# Patient Record
Sex: Male | Born: 1985 | Race: White | Hispanic: No | Marital: Married | Smoking: Former smoker
Health system: Southern US, Community
[De-identification: ages and names within clinical notes are randomized; demographics above are authoritative.]

## PROBLEM LIST (undated history)

## (undated) DIAGNOSIS — I10 Essential (primary) hypertension: Secondary | ICD-10-CM

## (undated) DIAGNOSIS — E785 Hyperlipidemia, unspecified: Secondary | ICD-10-CM

## (undated) DIAGNOSIS — F32A Depression, unspecified: Secondary | ICD-10-CM

## (undated) HISTORY — DX: Hyperlipidemia, unspecified: E78.5

## (undated) HISTORY — PX: KNEE SURGERY: SHX244

## (undated) HISTORY — PX: WISDOM TOOTH EXTRACTION: SHX21

## (undated) HISTORY — DX: Depression, unspecified: F32.A

---

## 2019-07-10 ENCOUNTER — Other Ambulatory Visit (INDEPENDENT_AMBULATORY_CARE_PROVIDER_SITE_OTHER): Payer: PRIVATE HEALTH INSURANCE | Admitting: Plastic Surgery

## 2019-07-10 DIAGNOSIS — S62111A Displaced fracture of triquetrum [cuneiform] bone, right wrist, initial encounter for closed fracture: Secondary | ICD-10-CM

## 2019-07-10 NOTE — Progress Notes (Signed)
   Referring Provider No referring provider defined for this encounter.   CC: No chief complaint on file. Right wrist pain  Paul Russo is an 34 y.o. male.  HPI: Patient presents to me with a 3-week history of right-sided wrist pain.  He had a fall on the ice in [place] about 3 weeks ago and landed directly on his right hand with the wrist extended.  He had significant pain immediately but was able to move it.  As the time is passed he has been able to move his wrist and fingers fairly normally however he still has quite a bit of pain when loading the wrist and wrist extension.  He has quite a bit of difficulty pushing up from a chair or pushing up from the floor.  He has tenderness in 1 specific spot over the dorsal ulnar area of the wrist and this is his main concern.  Not on File  No outpatient encounter medications on file as of 07/10/2019.   No facility-administered encounter medications on file as of 07/10/2019.  Noncontributory  No past medical history on file. Noncontributory No family history on file.  Social History   Social History Narrative  . Not on file  Negative for tobacco use  Review of Systems General: Denies fevers, chills, weight loss CV: Denies chest pain, shortness of breath, palpitations  Physical Exam No flowsheet data found.  General:  No acute distress,  Alert and oriented, Non-Toxic, Normal speech and affect Right hand: Fingers are well perfused with normal capillary refill and a palpable radial pulse.  Sensation is intact throughout.  He has full range of motion.  Specifically he has full finger flexion and extension without pain.  He has minimal discomfort with wrist flexion and extension.  He has minimal discomfort with radial and ulnar deviation and pronation/supination.  He is point tender over the dorsal aspect of the triquetrum.  X-ray was performed at his office and read by me.  It suggested dorsal chip fracture of the triquetrum seen on lateral  view.  AP and oblique views look fairly unremarkable with normal carpal spacing and alignment.  Scapholunate interval is preserved in the scapholunate angle on the lateral view is appropriate.  Assessment/Plan Patient is 34 year old male with a suspected dorsal chip fracture of the triquetrum.  His imaging correlates with his clinical findings.  Being that its been 3 weeks and he is still having quite a bit of pain I think it is reasonable to get a CT scan to evaluate this in more detail.  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  We will plan on getting a noncontrast CT scan of the right wrist to evaluate the bony architecture.  Cindra Presume 07/10/2019, 8:55 AM

## 2019-07-20 ENCOUNTER — Ambulatory Visit (HOSPITAL_COMMUNITY): Payer: PRIVATE HEALTH INSURANCE

## 2019-07-21 ENCOUNTER — Other Ambulatory Visit: Payer: Self-pay

## 2019-07-21 ENCOUNTER — Ambulatory Visit (HOSPITAL_COMMUNITY)
Admission: RE | Admit: 2019-07-21 | Discharge: 2019-07-21 | Disposition: A | Discharge status: Home / Self Care | Payer: PRIVATE HEALTH INSURANCE | Source: Ambulatory Visit | Attending: Plastic Surgery | Admitting: Plastic Surgery

## 2019-07-21 DIAGNOSIS — S62111A Displaced fracture of triquetrum [cuneiform] bone, right wrist, initial encounter for closed fracture: Secondary | ICD-10-CM | POA: Diagnosis present

## 2020-03-28 ENCOUNTER — Other Ambulatory Visit: Payer: PRIVATE HEALTH INSURANCE

## 2020-03-28 DIAGNOSIS — Z20822 Contact with and (suspected) exposure to covid-19: Secondary | ICD-10-CM

## 2020-03-29 LAB — NOVEL CORONAVIRUS, NAA: SARS-CoV-2, NAA: NOT DETECTED

## 2020-03-29 LAB — SARS-COV-2, NAA 2 DAY TAT

## 2020-08-16 ENCOUNTER — Ambulatory Visit: Payer: PRIVATE HEALTH INSURANCE | Admitting: Internal Medicine

## 2020-09-19 ENCOUNTER — Ambulatory Visit (INDEPENDENT_AMBULATORY_CARE_PROVIDER_SITE_OTHER): Payer: PRIVATE HEALTH INSURANCE | Admitting: Internal Medicine

## 2020-09-19 ENCOUNTER — Other Ambulatory Visit: Payer: Self-pay

## 2020-09-19 ENCOUNTER — Encounter: Payer: Self-pay | Admitting: Internal Medicine

## 2020-09-19 VITALS — BP 116/74 | HR 74 | Temp 98.3°F | Ht 70.0 in | Wt 233.0 lb

## 2020-09-19 DIAGNOSIS — R0683 Snoring: Secondary | ICD-10-CM

## 2020-09-19 DIAGNOSIS — E6609 Other obesity due to excess calories: Secondary | ICD-10-CM | POA: Diagnosis not present

## 2020-09-19 DIAGNOSIS — Z6832 Body mass index (BMI) 32.0-32.9, adult: Secondary | ICD-10-CM

## 2020-09-19 DIAGNOSIS — R5382 Chronic fatigue, unspecified: Secondary | ICD-10-CM

## 2020-09-19 DIAGNOSIS — E559 Vitamin D deficiency, unspecified: Secondary | ICD-10-CM | POA: Diagnosis not present

## 2020-09-19 DIAGNOSIS — Z0001 Encounter for general adult medical examination with abnormal findings: Secondary | ICD-10-CM

## 2020-09-19 DIAGNOSIS — Z0000 Encounter for general adult medical examination without abnormal findings: Secondary | ICD-10-CM | POA: Diagnosis not present

## 2020-09-19 DIAGNOSIS — Z23 Encounter for immunization: Secondary | ICD-10-CM | POA: Diagnosis not present

## 2020-09-19 LAB — LIPID PANEL
Cholesterol: 200 mg/dL (ref 0–200)
HDL: 49.3 mg/dL (ref >39.00)
LDL Cholesterol: 139 mg/dL — ABNORMAL HIGH (ref 0–99)
NonHDL: 151.11
Total CHOL/HDL Ratio: 4
Triglycerides: 63 mg/dL (ref 0.0–149.0)
VLDL: 12.6 mg/dL (ref 0.0–40.0)

## 2020-09-19 LAB — CBC WITH DIFFERENTIAL/PLATELET
Basophils Absolute: 0.1 10*3/uL (ref 0.0–0.1)
Basophils Relative: 1 % (ref 0.0–3.0)
Eosinophils Absolute: 0.2 10*3/uL (ref 0.0–0.7)
Eosinophils Relative: 2.8 % (ref 0.0–5.0)
HCT: 46.7 % (ref 39.0–52.0)
Hemoglobin: 16.1 g/dL (ref 13.0–17.0)
Lymphocytes Relative: 32.8 % (ref 12.0–46.0)
Lymphs Abs: 2.7 10*3/uL (ref 0.7–4.0)
MCHC: 34.5 g/dL (ref 30.0–36.0)
MCV: 86 fl (ref 78.0–100.0)
Monocytes Absolute: 0.8 10*3/uL (ref 0.1–1.0)
Monocytes Relative: 9.3 % (ref 3.0–12.0)
Neutro Abs: 4.4 10*3/uL (ref 1.4–7.7)
Neutrophils Relative %: 54.1 % (ref 43.0–77.0)
Platelets: 366 10*3/uL (ref 150.0–400.0)
RBC: 5.44 Mil/uL (ref 4.22–5.81)
RDW: 13.5 % (ref 11.5–15.5)
WBC: 8.1 10*3/uL (ref 4.0–10.5)

## 2020-09-19 LAB — BASIC METABOLIC PANEL
BUN: 16 mg/dL (ref 6–23)
CO2: 29 mEq/L (ref 19–32)
Calcium: 9.5 mg/dL (ref 8.4–10.5)
Chloride: 105 mEq/L (ref 96–112)
Creatinine, Ser: 1.14 mg/dL (ref 0.40–1.50)
GFR: 82.37 mL/min (ref >60.00)
Glucose, Bld: 86 mg/dL (ref 70–99)
Potassium: 4.2 mEq/L (ref 3.5–5.1)
Sodium: 141 mEq/L (ref 135–145)

## 2020-09-19 LAB — HEPATIC FUNCTION PANEL
ALT: 41 U/L (ref 0–53)
AST: 17 U/L (ref 0–37)
Albumin: 4.7 g/dL (ref 3.5–5.2)
Alkaline Phosphatase: 64 U/L (ref 39–117)
Bilirubin, Direct: 0.1 mg/dL (ref 0.0–0.3)
Total Bilirubin: 0.5 mg/dL (ref 0.2–1.2)
Total Protein: 7.6 g/dL (ref 6.0–8.3)

## 2020-09-19 LAB — HEMOGLOBIN A1C: Hgb A1c MFr Bld: 5.4 % (ref 4.6–6.5)

## 2020-09-19 NOTE — Patient Instructions (Signed)

## 2020-09-19 NOTE — Progress Notes (Signed)
Subjective:  Patient ID: Paul Russo, male    DOB: 22-Mar-1986  Age: 35 y.o. MRN: [id number]  CC: Annual Exam  This visit occurred during the SARS-CoV-2 public health emergency.  Safety protocols were in place, including screening questions prior to the visit, additional usage of staff PPE, and extensive cleaning of exam room while observing appropriate contact time as indicated for disinfecting solutions.    HPI Paul Russo presents for a CPX and to establish.  He complains of fatigue, low libido, and snoring.  Outpatient Medications Prior to Visit  Medication Sig Dispense Refill  . buPROPion (WELLBUTRIN XL) 150 MG 24 hr tablet Take 150 mg by mouth every morning.    . escitalopram (LEXAPRO) 5 MG tablet Take 5 mg by mouth every morning.    . lamoTRIgine (LAMICTAL) 150 MG tablet lamotrigine 150 mg tablet    . levETIRAcetam (KEPPRA) 250 MG tablet levetiracetam 250 mg tablet    . Multiple Vitamin (DAILY VITES) tablet Take 1 tablet by mouth daily.    . propranolol (INDERAL) 40 MG tablet Take 40 mg by mouth 2 (two) times daily.     No facility-administered medications prior to visit.    ROS Review of Systems  Constitutional: Positive for fatigue. Negative for appetite change, chills, diaphoresis and fever.  HENT: Negative.   Eyes: Negative.   Respiratory: Negative for cough, chest tightness, shortness of breath and wheezing.   Cardiovascular: Negative for chest pain, palpitations and leg swelling.  Gastrointestinal: Negative for abdominal pain, constipation, diarrhea, nausea and vomiting.  Endocrine: Negative.   Genitourinary: Negative.  Negative for difficulty urinating, scrotal swelling and testicular pain.  Musculoskeletal: Negative.  Negative for arthralgias.  Skin: Negative.  Negative for color change.  Neurological: Negative.  Negative for dizziness, weakness, light-headedness and headaches.  Hematological: Negative for adenopathy. Does not bruise/bleed easily.   Psychiatric/Behavioral: Positive for dysphoric mood and sleep disturbance. Negative for suicidal ideas. The patient is not nervous/anxious.     Objective:  BP 116/74   Pulse 74   Temp 98.3 F (36.8 C) (Oral)   Ht 5\' 10"  (1.778 m)   Wt 233 lb (105.7 kg)   SpO2 98%   BMI 33.43 kg/m   BP Readings from Last 3 Encounters:  09/19/20 116/74    Wt Readings from Last 3 Encounters:  09/19/20 233 lb (105.7 kg)    Physical Exam Vitals reviewed.  Constitutional:      Appearance: Normal appearance.  HENT:     Nose: Nose normal.     Mouth/Throat:     Mouth: Mucous membranes are moist.  Eyes:     General: No scleral icterus.    Conjunctiva/sclera: Conjunctivae normal.  Cardiovascular:     Rate and Rhythm: Normal rate and regular rhythm.     Heart sounds: No murmur heard.   Pulmonary:     Effort: Pulmonary effort is normal.     Breath sounds: No stridor. No wheezing, rhonchi or rales.  Abdominal:     General: Abdomen is flat.     Tenderness: There is no abdominal tenderness.  Musculoskeletal:        General: Normal range of motion.     Cervical back: Neck supple.     Right lower leg: No edema.     Left lower leg: No edema.  Lymphadenopathy:     Cervical: No cervical adenopathy.  Skin:    General: Skin is warm and dry.     Coloration: Skin is not pale.  Findings: No rash.  Neurological:     General: No focal deficit present.     Mental Status: He is alert.  Psychiatric:        Mood and Affect: Mood normal.        Behavior: Behavior normal.     Lab Results  Component Value Date   WBC 8.1 09/19/2020   HGB 16.1 09/19/2020   HCT 46.7 09/19/2020   PLT 366.0 09/19/2020   GLUCOSE 86 09/19/2020   CHOL 200 09/19/2020   TRIG 63.0 09/19/2020   HDL 49.30 09/19/2020   LDLCALC 139 (H) 09/19/2020   ALT 41 09/19/2020   AST 17 09/19/2020   NA 141 09/19/2020   K 4.2 09/19/2020   CL 105 09/19/2020   CREATININE 1.14 09/19/2020   BUN 16 09/19/2020   CO2 29 09/19/2020    TSH 0.91 09/19/2020   HGBA1C 5.4 09/19/2020    CT WRIST RIGHT WO CONTRAST  Result Date: 07/21/2019 CLINICAL DATA:  Medial wrist pain. Fall 06/20/2019 EXAM: CT OF THE RIGHT WRIST WITHOUT CONTRAST TECHNIQUE: Multidetector CT imaging of the right wrist was performed according to the standard protocol. Multiplanar CT image reconstructions were also generated. COMPARISON:  None. FINDINGS: Bones/Joint/Cartilage Minimally displaced fracture involving the dorsal cortex of the proximal scaphoid (series 3, images 162-166). Additional minimally displaced fracture involving the dorsal cortex of the distal capitate with intra-articular involvement of the fourth CMC joint (series 3, images 199-201; series 11, images 11-12). No additional fractures. Carpal intraosseous spaces are maintained. No malalignment. No significant degenerative findings. Ligaments Suboptimally assessed by CT. Muscles and Tendons No musculotendinous abnormality identified within the limitations of this exam. Soft tissues Soft tissue swelling at the dorsal aspect of the carpus. No soft tissue fluid collection or hematoma. IMPRESSION: 1. Minimally displaced fracture involving the dorsal cortex of the proximal scaphoid. 2. Additional minimally displaced fracture involving the dorsal cortex of the distal capitate with intra-articular involvement of the fourth CMC joint. Electronically Signed   By: Davina Poke D.O.   On: 07/21/2019 15:48    Assessment & Plan:   Paul Russo was seen today for annual exam.  Diagnoses and all orders for this visit:  Encounter for general adult medical examination with abnormal findings- Exam completed, labs reviewed - statin tx is not indicated, vaccines reviewed, no cancer screenings are indicated, pt ed material was given. -     Lipid panel; Future -     Hepatitis C antibody; Future -     HIV Antibody (routine testing w rflx); Future -     HIV Antibody (routine testing w rflx) -     Hepatitis C antibody -      Lipid panel  Chronic fatigue- Labs are unremarkable. Will recommend a sleep study. -     CBC with Differential/Platelet; Future -     Basic metabolic panel; Future -     VITAMIN D 25 Hydroxy (Vit-D Deficiency, Fractures); Future -     Hepatic function panel; Future -     TSH; Future -     Testosterone Total,Free,Bio, Males; Future -     Prolactin; Future -     Prolactin -     Testosterone Total,Free,Bio, Males -     TSH -     Hepatic function panel -     VITAMIN D 25 Hydroxy (Vit-D Deficiency, Fractures) -     Basic metabolic panel -     CBC with Differential/Platelet  Class 1 obesity due to excess calories  without serious comorbidity with body mass index (BMI) of 32.0 to 32.9 in adult- Labs are neg for secondary causes or complications. -     Basic metabolic panel; Future -     VITAMIN D 25 Hydroxy (Vit-D Deficiency, Fractures); Future -     Hepatic function panel; Future -     Hemoglobin A1c; Future -     Hemoglobin A1c -     Hepatic function panel -     VITAMIN D 25 Hydroxy (Vit-D Deficiency, Fractures) -     Basic metabolic panel  Vitamin D deficiency -     Cholecalciferol 50 MCG (2000 UT) TABS; Take 1 tablet (2,000 Units total) by mouth daily.  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 I am also having him maintain his lamoTRIgine, escitalopram, buPROPion, propranolol, Daily Vites, and levETIRAcetam.  Meds ordered this encounter  Medications  . Cholecalciferol 50 MCG (2000 UT) TABS    Sig: Take 1 tablet (2,000 Units total) by mouth daily.    Dispense:  90 tablet    Refill:  3     Follow-up: Return in about 6 months (around 03/22/2021).  Scarlette Calico, MD

## 2020-09-20 DIAGNOSIS — Z0001 Encounter for general adult medical examination with abnormal findings: Secondary | ICD-10-CM | POA: Insufficient documentation

## 2020-09-20 DIAGNOSIS — Z6832 Body mass index (BMI) 32.0-32.9, adult: Secondary | ICD-10-CM | POA: Insufficient documentation

## 2020-09-20 DIAGNOSIS — E6609 Other obesity due to excess calories: Secondary | ICD-10-CM | POA: Insufficient documentation

## 2020-09-20 DIAGNOSIS — R5382 Chronic fatigue, unspecified: Secondary | ICD-10-CM | POA: Insufficient documentation

## 2020-09-20 DIAGNOSIS — E559 Vitamin D deficiency, unspecified: Secondary | ICD-10-CM | POA: Insufficient documentation

## 2020-09-20 LAB — VITAMIN D 25 HYDROXY (VIT D DEFICIENCY, FRACTURES): VITD: 19.45 ng/mL — ABNORMAL LOW (ref 30.00–100.00)

## 2020-09-20 LAB — TESTOSTERONE TOTAL,FREE,BIO, MALES
Albumin: 4.6 g/dL (ref 3.6–5.1)
Sex Hormone Binding: 32 nmol/L (ref 10–50)
Testosterone, Bioavailable: 100.1 ng/dL — ABNORMAL LOW (ref >=110.0)
Testosterone, Free: 47.7 pg/mL (ref 46.0–224.0)
Testosterone: 362 ng/dL (ref 250–827)

## 2020-09-20 LAB — HEPATITIS C ANTIBODY
Hepatitis C Ab: NONREACTIVE
SIGNAL TO CUT-OFF: 0.01 (ref <1.00)

## 2020-09-20 LAB — TSH: TSH: 0.91 u[IU]/mL (ref 0.35–4.50)

## 2020-09-20 LAB — HIV ANTIBODY (ROUTINE TESTING W REFLEX): HIV 1&2 Ab, 4th Generation: NONREACTIVE

## 2020-09-20 LAB — PROLACTIN: Prolactin: 7.3 ng/mL (ref 2.0–18.0)

## 2020-09-20 MED ORDER — CHOLECALCIFEROL 50 MCG (2000 UT) PO TABS: 1.0000 | ORAL_TABLET | Freq: Every day | ORAL | 3 refills | Status: DC

## 2020-09-22 ENCOUNTER — Encounter: Payer: Self-pay | Admitting: Internal Medicine

## 2020-09-22 ENCOUNTER — Other Ambulatory Visit: Payer: Self-pay | Admitting: Internal Medicine

## 2020-09-22 DIAGNOSIS — R0683 Snoring: Secondary | ICD-10-CM | POA: Insufficient documentation

## 2020-09-22 NOTE — Addendum Note (Signed)
Addended by: Janith Lima on: 09/22/2020 02:11 PM   Modules accepted: Orders

## 2021-01-02 ENCOUNTER — Encounter: Payer: Self-pay | Admitting: Internal Medicine

## 2021-01-03 ENCOUNTER — Other Ambulatory Visit: Payer: Self-pay | Admitting: Internal Medicine

## 2021-01-03 DIAGNOSIS — R0683 Snoring: Secondary | ICD-10-CM

## 2021-02-22 ENCOUNTER — Other Ambulatory Visit: Payer: Self-pay | Admitting: Urology

## 2021-02-22 DIAGNOSIS — D4112 Neoplasm of uncertain behavior of left renal pelvis: Secondary | ICD-10-CM

## 2021-03-14 ENCOUNTER — Encounter (HOSPITAL_COMMUNITY): Payer: Self-pay

## 2021-03-14 ENCOUNTER — Encounter (HOSPITAL_COMMUNITY)
Admission: RE | Admit: 2021-03-14 | Discharge: 2021-03-14 | Disposition: A | Discharge status: Home / Self Care | Payer: No Typology Code available for payment source | Source: Ambulatory Visit | Attending: Urology | Admitting: Urology

## 2021-03-14 ENCOUNTER — Institutional Professional Consult (permissible substitution): Payer: PRIVATE HEALTH INSURANCE | Admitting: Neurology

## 2021-03-14 ENCOUNTER — Other Ambulatory Visit: Payer: Self-pay

## 2021-03-14 DIAGNOSIS — Z01818 Encounter for other preprocedural examination: Secondary | ICD-10-CM | POA: Diagnosis not present

## 2021-03-14 HISTORY — DX: Essential (primary) hypertension: I10

## 2021-03-14 LAB — BASIC METABOLIC PANEL
Anion gap: 9 (ref 5–15)
BUN: 18 mg/dL (ref 6–20)
CO2: 23 mmol/L (ref 22–32)
Calcium: 9.7 mg/dL (ref 8.9–10.3)
Chloride: 109 mmol/L (ref 98–111)
Creatinine, Ser: 1.3 mg/dL — ABNORMAL HIGH (ref 0.61–1.24)
GFR, Estimated: >60 mL/min (ref >60)
Glucose, Bld: 140 mg/dL — ABNORMAL HIGH (ref 70–99)
Potassium: 4.1 mmol/L (ref 3.5–5.1)
Sodium: 141 mmol/L (ref 135–145)

## 2021-03-14 LAB — CBC
HCT: 47.8 % (ref 39.0–52.0)
Hemoglobin: 15.8 g/dL (ref 13.0–17.0)
MCH: 28.8 pg (ref 26.0–34.0)
MCHC: 33.1 g/dL (ref 30.0–36.0)
MCV: 87.2 fL (ref 80.0–100.0)
Platelets: 361 10*3/uL (ref 150–400)
RBC: 5.48 MIL/uL (ref 4.22–5.81)
RDW: 12.3 % (ref 11.5–15.5)
WBC: 6.6 10*3/uL (ref 4.0–10.5)
nRBC: 0 % (ref 0.0–0.2)

## 2021-03-14 NOTE — Progress Notes (Signed)
COVID Vaccine Completed: Yes Date COVID Vaccine completed: 05/27/20 COVID vaccine manufacturer: Pfizer      PCP - Dr. Scarlette Calico. Cardiologist -   Chest x-ray -  EKG -  Stress Test -  ECHO -  Cardiac Cath -  Pacemaker/ICD device last checked:  Sleep Study -  CPAP -   Fasting Blood Sugar -  Checks Blood Sugar _____ times a day  Blood Thinner Instructions: Aspirin Instructions: Last Dose:  Anesthesia review: Hx: HTN.  Patient denies shortness of breath, fever, cough and chest pain at PAT appointment   Patient verbalized understanding of instructions that were given to them at the PAT appointment. Patient was also instructed that they will need to review over the PAT instructions again at home before surgery.

## 2021-03-14 NOTE — Patient Instructions (Signed)
DUE TO COVID-19 ONLY ONE VISITOR IS ALLOWED TO COME WITH YOU AND STAY IN THE WAITING ROOM ONLY DURING PRE OP AND PROCEDURE.   **NO VISITORS ARE ALLOWED IN THE SHORT STAY AREA OR RECOVERY ROOM!!**  IF YOU WILL BE ADMITTED INTO THE HOSPITAL YOU ARE ALLOWED ONLY TWO SUPPORT PEOPLE DURING VISITATION HOURS ONLY (10AM -8PM)   The support person(s) may change daily. The support person(s) must pass our screening, gel in and out, and wear a mask at all times, including in the patient's room. Patients must also wear a mask when staff or their support person are in the room.  No visitors under the age of 1. Any visitor under the age of 28 must be accompanied by an adult.    COVID SWAB TESTING MUST BE COMPLETED ON: 03/30/21  **MUST PRESENT COMPLETED FORM AT TESTING SITE**    [facility] (backside of the building) You are not required to quarantine, however you are required to wear a well-fitted mask when you are out and around people not in your household.  Hand Hygiene often Do NOT share personal items Notify your provider if you are in close contact with someone who has COVID or you develop fever 100.4 or greater, new onset of sneezing, cough, sore throat, shortness of breath or body aches.  [place], [address], must go inside of the hospital, NOT A DRIVE THRU!  (Must self quarantine after testing. Follow instructions on handout.)       Your procedure is scheduled on:    Report to [facility] Main  Entrance   Report to Short Stay at 5:15 AM   [facility])   Call this number if you have problems the morning of surgery [phone]   Do not eat food :After Midnight.   May have liquids until : 4:30 AM.   day of surgery  CLEAR LIQUID DIET  Foods Allowed                                                                     Foods Excluded  Water, Black Coffee and tea, regular and decaf                              liquids that you cannot  Plain Jell-O in any flavor  (No red)                                           see through such as: Fruit ices (not with fruit pulp)                                     milk, soups, orange juice              Iced Popsicles (No red)  All solid food                                   Apple juices Sports drinks like Gatorade (No red) Lightly seasoned clear broth or consume(fat free) Sugar,  Sample Menu Breakfast                                Lunch                                     Supper Cranberry juice                    Beef broth                            Chicken broth Jell-O                                     Grape juice                           Apple juice Coffee or tea                        Jell-O                                      Popsicle                                                Coffee or tea                        Coffee or tea     Oral Hygiene is also important to reduce your risk of infection.                                    Remember - BRUSH YOUR TEETH THE MORNING OF SURGERY WITH YOUR REGULAR TOOTHPASTE   Do NOT smoke after Midnight   Take these medicines the morning of surgery with A SIP OF WATER: lamotrigine,levetiracetam(kepra),Wellbutrin,escitalopram,propranolol.  DO NOT TAKE ANY ORAL DIABETIC MEDICATIONS DAY OF YOUR SURGERY                              You may not have any metal on your body including hair pins, jewelry, and body piercing             Do not wear lotions, powders, perfumes/cologne, or deodorant               Men may shave face and neck.   Do not bring valuables to the hospital. [place].  Contacts, dentures or bridgework may not be worn into surgery.   Bring small overnight bag day of surgery.    Patients discharged the day of surgery will not be allowed to drive home.   Special Instructions: Bring a copy  of your healthcare power of attorney and living will documents         the day of surgery if you haven't scanned them in before.              Please read over the following fact sheets you were given: IF YOU HAVE QUESTIONS ABOUT YOUR PRE OP INSTRUCTIONS PLEASE CALL [phone]   [place] - Preparing for Surgery Before surgery, you can play an important role.  Because skin is not sterile, your skin needs to be as free of germs as possible.  You can reduce the number of germs on your skin by washing with CHG (chlorahexidine gluconate) soap before surgery.  CHG is an antiseptic cleaner which kills germs and bonds with the skin to continue killing germs even after washing. Please DO NOT use if you have an allergy to CHG or antibacterial soaps.  If your skin becomes reddened/irritated stop using the CHG and inform your nurse when you arrive at Short Stay. Do not shave (including legs and underarms) for at least 48 hours prior to the first CHG shower.  You may shave your face/neck. Please follow these instructions carefully:  1.  Shower with CHG Soap the night before surgery and the  morning of Surgery.  2.  If you choose to wash your hair, wash your hair first as usual with your  normal  shampoo.  3.  After you shampoo, rinse your hair and body thoroughly to remove the  shampoo.                           4.  Use CHG as you would any other liquid soap.  You can apply chg directly  to the skin and wash                       Gently with a scrungie or clean washcloth.  5.  Apply the CHG Soap to your body ONLY FROM THE NECK DOWN.   Do not use on face/ open                           Wound or open sores. Avoid contact with eyes, ears mouth and genitals (private parts).                       Wash face,  Genitals (private parts) with your normal soap.             6.  Wash thoroughly, paying special attention to the area where your surgery  will be performed.  7.  Thoroughly rinse your body with warm water  from the neck down.  8.  DO NOT shower/wash with your normal soap after using and rinsing off  the CHG Soap.                9.  Pat yourself dry with a clean towel.            10.  Wear clean pajamas.            11.  Place clean sheets on your bed the night of your first  shower and do not  sleep with pets. Day of Surgery : Do not apply any lotions/deodorants the morning of surgery.  Please wear clean clothes to the hospital/surgery center.  FAILURE TO FOLLOW THESE INSTRUCTIONS MAY RESULT IN THE CANCELLATION OF YOUR SURGERY PATIENT SIGNATURE_________________________________  NURSE SIGNATURE__________________________________  ________________________________________________________________________

## 2021-03-30 ENCOUNTER — Other Ambulatory Visit: Payer: Self-pay | Admitting: Urology

## 2021-03-31 LAB — SARS CORONAVIRUS 2 (TAT 6-24 HRS): SARS Coronavirus 2: NEGATIVE

## 2021-03-31 NOTE — H&P (Signed)
Office Visit Report     03/14/2021   --------------------------------------------------------------------------------   Paul Russo  MRN: [id number]  DOB: 12/23/1985, 35 year old Male  SSN:    PRIMARY CARE:    REFERRING:    PROVIDER:  Festus Aloe, M.D.  TREATING:  Raynelle Bring, M.D.  LOCATION:  [facility] 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 A dedicated CT of the abdomen with and without contrast was performed on 02/17/21 and confirmed a 2.8 cm enhancing renal mass consistent with renal malignancy.   Family history of kidney cancer: None.  Family history of ESRD: None.   Imaging: CT abdomen (02/17/21)  Side of renal neoplasm: Left  Size of renal neoplasm: 2.8 cm  Location of renal neoplasm: Left anterior upper pole  Exophytic or endophytic: Endophytic  Renal nephrometry score: 8a   Renal artery anatomy: Single renal artery  Renal vein anatomy: Single renal vein   Contralateral renal lesions: None.  Regional lymphadenopathy: None.  Adrenal masses: None.  Renal vein/IVC involvement: No.  Metastatic disease to the abdomen: No.   Chest imaging: CT chest (02/23/21)  LFTs: Normal.   Baseline renal function: Cr 1.3, eGFR ml/min   PMH: Past medical history is significant for no major medical comorbidities.  PSH: No abdominal surgeries.     ALLERGIES: No Allergies    MEDICATIONS: Bupropion Hcl  Keppra  Lamictal  Lexapro     GU PSH: Locm 300-399Mg/Ml Iodine,1Ml - 02/17/2021 Vasectomy - 03/04/2020     NON-GU PSH: Knee Arthroscopy     GU PMH: Left uncertain neoplasm of kidney - 02/23/2021, - 02/17/2021    NON-GU PMH: Low testosterone, check endocrine panel. ALso he has had right wrist swelling for a day and  requested a uric acid. - 09/21/2020    FAMILY HISTORY: No Family History    SOCIAL HISTORY: Marital Status: Unknown    REVIEW OF SYSTEMS:    GU Review Male:   Patient denies frequent urination, hard to postpone urination, burning/ pain with urination, get up at night to urinate, leakage of urine, stream starts and stops, trouble starting your streams, and have to strain to urinate .  Gastrointestinal (Lower):   Patient denies diarrhea and constipation.  Gastrointestinal (Upper):   Patient denies vomiting and nausea.  Constitutional:   Patient denies fever, night sweats, weight loss, and fatigue.  Skin:   Patient denies skin rash/ lesion and itching.  Eyes:   Patient denies blurred vision and double vision.  Ears/ Nose/ Throat:   Patient denies sore throat and sinus problems.  Hematologic/Lymphatic:   Patient denies swollen glands and easy bruising.  Cardiovascular:   Patient denies leg swelling and chest pains.  Respiratory:   Patient denies cough and shortness of breath.  Endocrine:   Patient denies excessive thirst.  Musculoskeletal:   Patient denies back pain and joint pain.  Neurological:   Patient denies headaches and dizziness.  Psychologic:   Patient denies depression and anxiety.   VITAL SIGNS: None   MULTI-SYSTEM PHYSICAL EXAMINATION:    Constitutional: Well-nourished. No physical deformities. Normally developed. Good grooming.  Neck: Neck symmetrical, not swollen. Normal tracheal position.  Respiratory: No labored breathing, no use of accessory muscles. Clear bilaterally.  Cardiovascular: Normal temperature, normal extremity pulses, no swelling, no  varicosities. RRR.  Lymphatic: No enlargement of neck, axillae, groin.  Skin: No paleness, no jaundice, no cyanosis. No lesion, no ulcer, no rash.  Neurologic / Psychiatric: Oriented to time, oriented to place, oriented to person. No depression, no anxiety, no agitation.  Gastrointestinal: No mass, no tenderness, no rigidity, non  obese abdomen.  Eyes: Normal conjunctivae. Normal eyelids.  Ears, Nose, Mouth, and Throat: Left ear no scars, no lesions, no masses. Right ear no scars, no lesions, no masses. Nose no scars, no lesions, no masses. Normal hearing. Normal lips.  Musculoskeletal: Normal gait and station of head and neck.     Complexity of Data:  Records Review:   Previous Patient Records  X-Ray Review: C.T. Chest/ Abd/Pelvis: Reviewed Films.     02/17/21 12/08/20  Hormones  Testosterone, Total 1049.4 ng/dL 843.4 ng/dL   Notes:                     CLINICAL DATA: Further evaluation of renal lesion seen on prior MRI   EXAM:  CT ABDOMEN WITHOUT AND WITH CONTRAST   TECHNIQUE:  Multidetector CT imaging of the abdomen was performed following the  standard protocol before and following the bolus administration of  intravenous contrast.   CONTRAST: 80 cc of Omnipaque 350   COMPARISON: None available for comparison at time dictation.   FINDINGS:  Lower chest: No acute abnormality.   Hepatobiliary: Fatty infiltration along the falciform ligament. No  suspicious hepatic lesion. Gallbladder is unremarkable.   Pancreas: Within normal limits.   Spleen: Within normal limits.   Adrenals/Urinary Tract: Bilateral adrenal glands are unremarkable.   No hydronephrosis.   Heterogeneous enhancing 2.5 x 2.1 x 2.8 cm left lateral interpolar  renal lesion with internal foci of dystrophic calcification on image  49/5.   Hypodense 5 mm left medial interpolar renal lesion which is  technically too small to accurately characterize.   Right kidney is unremarkable.   Stomach/Bowel: Stomach is within normal limits. Appendix appears  normal. No evidence of bowel wall thickening, distention, or  inflammatory changes.   Vascular/Lymphatic: No abdominal aortic aneurysm. Left renal vein is  patent without suspicious intraluminal filling defect. No  pathologically enlarged abdominal lymph nodes.   Other: No abdominal  ascites.   Musculoskeletal: No acute osseous abnormality. No aggressive lytic  or blastic lesion of bone.   IMPRESSION:  1. Heterogeneous enhancing 2.8 cm left lateral interpolar renal  lesion with internal foci of dystrophic calcification. Findings are  highly suspicious for renal cell carcinoma.  2. No pathologically enlarged abdominal lymph nodes.  3. No evidence of tumor in vein.    Electronically Signed  By: Dahlia Bailiff M.D.  On: 02/20/2021 09:21   CLINICAL DATA: Left enhancing renal mass compatible with renal cell  carcinoma, staging workup   EXAM:  CT CHEST WITHOUT CONTRAST   TECHNIQUE:  Multidetector CT imaging of the chest was performed following the  standard protocol without IV contrast.   COMPARISON: CT abdomen 02/18/2020   FINDINGS:  Cardiovascular: Unremarkable   Mediastinum/Nodes: No thoracic adenopathy identified. No specific  esophageal abnormality observed.   Lungs/Pleura: 2 mm subpleural nodule at the right lung apex on image  20 series 3. Mild scarring or subsegmental atelectasis medially in  the right middle lobe for example on image 79 series 3. 2 mm  subpleural right upper lobe nodule along the upper margin of the  right major fissure on image 40 series 3. 2 mm left apicoposterior  segment upper  lobe nodule on image 18 series 3. Anterior accessory  fissure in the left upper lobe.   Upper Abdomen: Small calcification along the margin of the known  left mid kidney mass.   Musculoskeletal: Mild bilateral gynecomastia. Minimal lower thoracic  spondylosis. No findings of osseous metastatic disease in the  thorax.   IMPRESSION:  1. I identify three tiny 2 mm subpleural nodules in the lungs. These  are highly likely to be benign, but in the context of patient's  renal mass these merit surveillance by chest CT.    Electronically Signed  By: Van Clines M.D.  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            Document Letter(s):  Created for Patient: Clinical Summary         Notes:   1. Left renal neoplasm: The patient was provided information regarding their renal mass including the relative risk of benign versus malignant pathology and the natural history of renal cell carcinoma and other possible malignancies of the kidney. The role of renal biopsy, laboratory testing, and imaging studies to further characterize renal masses and/or the presence of metastatic disease were explained. We discussed the role of active surveillance, surgical therapy with both radical nephrectomy and nephron-sparing surgery, and ablative therapy in the treatment of renal masses. In addition, we discussed our goals of providing an accurate diagnosis and oncologic control while maintaining optimal renal function as appropriate based on the size, location, and complexity of their renal mass as well as their co-morbidities. We have discussed the risks of treatment in detail including but not limited to bleeding, infection, heart attack, stroke, death, venothromoboembolism, cancer recurrence, injury/damage to surrounding organs and structures, urine leak, the possibility of open surgical conversion for patients undergoing minimally invasive surgery, the risk of developing chronic kidney disease and its associated implications, and the potential risk of end stage renal disease possibly necessitating dialysis.   Paul Russo wishes to proceed with left RAL partial nephrectomy. This has been scheduled.      PREVIOUSLY APPENDED NOTES:   We will plan to have him get repeat imaging of the chest and abdomen in the future to monitor his very small non-specific pulmonary nodules seen on his chest CT  during his surveillance imaging follow up. * Entered byLester Chaseton Yepiz, 03/16/2021 11:18 AM*          Next Appointment:      Next Appointment: 04/03/2021 07:15 AM    Appointment Type: Surgery     Location: [facility] [phone]    Provider: Raynelle Bring, M.D.    Reason for Visit: [facility]/EXT REC LEFT RA LAP PARTIAL NEPHRECTOMY WITH AMANDA

## 2021-04-02 NOTE — Anesthesia Preprocedure Evaluation (Addendum)
Anesthesia Evaluation  Patient identified by MRN, date of birth, ID band Patient awake    Reviewed: Allergy & Precautions, NPO status , Patient's Chart, lab work & pertinent test results  Airway Mallampati: I  TM Distance: >3 FB Neck ROM: Full    Dental no notable dental hx.    Pulmonary former smoker,    Pulmonary exam normal breath sounds clear to auscultation       Cardiovascular hypertension, Normal cardiovascular exam Rhythm:Regular Rate:Normal     Neuro/Psych PSYCHIATRIC DISORDERS Depression negative neurological ROS     GI/Hepatic negative GI ROS, Neg liver ROS,   Endo/Other  negative endocrine ROS  Renal/GU negative Renal ROS     Musculoskeletal negative musculoskeletal ROS (+)   Abdominal   Peds  Hematology negative hematology ROS (+)   Anesthesia Other Findings LEFT RENAL NEOPLASM  Reproductive/Obstetrics                            Anesthesia Physical Anesthesia Plan  ASA: 2  Anesthesia Plan: General   Post-op Pain Management:    Induction: Intravenous  PONV Risk Score and Plan: 2 and Ondansetron, Dexamethasone, Midazolam and Treatment may vary due to age or medical condition  Airway Management Planned: Oral ETT  Additional Equipment:   Intra-op Plan:   Post-operative Plan: Extubation in OR  Informed Consent: I have reviewed the patients History and Physical, chart, labs and discussed the procedure including the risks, benefits and alternatives for the proposed anesthesia with the patient or authorized representative who has indicated his/her understanding and acceptance.     Dental advisory given  Plan Discussed with: CRNA  Anesthesia Plan Comments:        Anesthesia Quick Evaluation

## 2021-04-03 ENCOUNTER — Other Ambulatory Visit: Payer: Self-pay

## 2021-04-03 ENCOUNTER — Ambulatory Visit (HOSPITAL_COMMUNITY): Payer: PRIVATE HEALTH INSURANCE | Admitting: Certified Registered Nurse Anesthetist

## 2021-04-03 ENCOUNTER — Observation Stay (HOSPITAL_COMMUNITY)
Admission: RE | Admit: 2021-04-03 | Discharge: 2021-04-04 | Disposition: A | Discharge status: Home / Self Care | Payer: PRIVATE HEALTH INSURANCE | Source: Ambulatory Visit | Attending: Urology | Admitting: Urology

## 2021-04-03 ENCOUNTER — Encounter (HOSPITAL_COMMUNITY)
Admission: RE | Disposition: A | Discharge status: Home / Self Care | Payer: Self-pay | Source: Ambulatory Visit | Attending: Urology

## 2021-04-03 ENCOUNTER — Encounter (HOSPITAL_COMMUNITY): Payer: Self-pay | Admitting: Urology

## 2021-04-03 DIAGNOSIS — C642 Malignant neoplasm of left kidney, except renal pelvis: Principal | ICD-10-CM | POA: Insufficient documentation

## 2021-04-03 DIAGNOSIS — D49512 Neoplasm of unspecified behavior of left kidney: Secondary | ICD-10-CM | POA: Diagnosis present

## 2021-04-03 HISTORY — PX: ROBOTIC ASSITED PARTIAL NEPHRECTOMY: SHX6087

## 2021-04-03 LAB — BASIC METABOLIC PANEL
Anion gap: 4 — ABNORMAL LOW (ref 5–15)
BUN: 14 mg/dL (ref 6–20)
CO2: 27 mmol/L (ref 22–32)
Calcium: 8.6 mg/dL — ABNORMAL LOW (ref 8.9–10.3)
Chloride: 106 mmol/L (ref 98–111)
Creatinine, Ser: 1.63 mg/dL — ABNORMAL HIGH (ref 0.61–1.24)
GFR, Estimated: 55 mL/min — ABNORMAL LOW (ref >60)
Glucose, Bld: 108 mg/dL — ABNORMAL HIGH (ref 70–99)
Potassium: 5 mmol/L (ref 3.5–5.1)
Sodium: 137 mmol/L (ref 135–145)

## 2021-04-03 LAB — HEMOGLOBIN AND HEMATOCRIT, BLOOD
HCT: 47.4 % (ref 39.0–52.0)
Hemoglobin: 15.6 g/dL (ref 13.0–17.0)

## 2021-04-03 LAB — TYPE AND SCREEN
ABO/RH(D): O POS
Antibody Screen: NEGATIVE

## 2021-04-03 LAB — ABO/RH: ABO/RH(D): O POS

## 2021-04-03 SURGERY — NEPHRECTOMY, PARTIAL, ROBOT-ASSISTED
Anesthesia: General | Laterality: Left

## 2021-04-03 MED ORDER — KETAMINE HCL 10 MG/ML IJ SOLN
INTRAMUSCULAR | Status: AC
  Filled 2021-04-03: qty 1

## 2021-04-03 MED ORDER — KETAMINE HCL 10 MG/ML IJ SOLN
INTRAMUSCULAR | Status: DC | PRN
  Administered 2021-04-03: 40 mg via INTRAVENOUS
  Administered 2021-04-03: 10 mg via INTRAVENOUS

## 2021-04-03 MED ORDER — CHLORHEXIDINE GLUCONATE 0.12 % MT SOLN: 15.0000 mL | Freq: Once | OROMUCOSAL | Status: AC

## 2021-04-03 MED ORDER — BUPIVACAINE LIPOSOME 1.3 % IJ SUSP
INTRAMUSCULAR | Status: DC | PRN
  Administered 2021-04-03: 20 mL

## 2021-04-03 MED ORDER — OXYCODONE HCL 5 MG/5ML PO SOLN: 5.0000 mg | Freq: Once | ORAL | Status: AC | PRN

## 2021-04-03 MED ORDER — DEXAMETHASONE SODIUM PHOSPHATE 4 MG/ML IJ SOLN
INTRAMUSCULAR | Status: DC | PRN
  Administered 2021-04-03: 10 mg via INTRAVENOUS

## 2021-04-03 MED ORDER — ONDANSETRON HCL 4 MG/2ML IJ SOLN: 4.0000 mg | INTRAMUSCULAR | Status: DC | PRN

## 2021-04-03 MED ORDER — LACTATED RINGERS IV SOLN: INTRAVENOUS | Status: DC

## 2021-04-03 MED ORDER — EPHEDRINE 5 MG/ML INJ
INTRAVENOUS | Status: AC
  Filled 2021-04-03: qty 5

## 2021-04-03 MED ORDER — POLYETHYLENE GLYCOL 3350 17 G PO PACK: 17.0000 g | PACK | Freq: Once | ORAL | Status: DC

## 2021-04-03 MED ORDER — OXYCODONE HCL 5 MG PO TABS
5.0000 mg | ORAL_TABLET | ORAL | Status: DC | PRN
  Administered 2021-04-03 – 2021-04-04 (×5): 5 mg via ORAL
  Filled 2021-04-03 (×5): qty 1

## 2021-04-03 MED ORDER — MORPHINE SULFATE (PF) 4 MG/ML IV SOLN
INTRAVENOUS | Status: AC
  Filled 2021-04-03: qty 1

## 2021-04-03 MED ORDER — MELATONIN 3 MG PO TABS
3.0000 mg | ORAL_TABLET | Freq: Every day | ORAL | Status: DC
  Administered 2021-04-03: 3 mg via ORAL
  Filled 2021-04-03: qty 1

## 2021-04-03 MED ORDER — ORAL CARE MOUTH RINSE
15.0000 mL | Freq: Once | OROMUCOSAL | Status: AC
  Administered 2021-04-03: 15 mL via OROMUCOSAL

## 2021-04-03 MED ORDER — ACETAMINOPHEN 10 MG/ML IV SOLN
1000.0000 mg | Freq: Four times a day (QID) | INTRAVENOUS | Status: AC
  Administered 2021-04-03 – 2021-04-04 (×4): 1000 mg via INTRAVENOUS
  Filled 2021-04-03 (×4): qty 100

## 2021-04-03 MED ORDER — TRAMADOL HCL 50 MG PO TABS: 50.0000 mg | ORAL_TABLET | Freq: Four times a day (QID) | ORAL | 0 refills | Status: DC | PRN

## 2021-04-03 MED ORDER — FENTANYL CITRATE (PF) 100 MCG/2ML IJ SOLN
INTRAMUSCULAR | Status: AC
  Filled 2021-04-03: qty 2

## 2021-04-03 MED ORDER — SUGAMMADEX SODIUM 200 MG/2ML IV SOLN
INTRAVENOUS | Status: DC | PRN
  Administered 2021-04-03: 300 mg via INTRAVENOUS

## 2021-04-03 MED ORDER — FENTANYL CITRATE PF 50 MCG/ML IJ SOSY
25.0000 ug | PREFILLED_SYRINGE | INTRAMUSCULAR | Status: DC | PRN
  Administered 2021-04-03 (×2): 50 ug via INTRAVENOUS

## 2021-04-03 MED ORDER — ROCURONIUM BROMIDE 10 MG/ML (PF) SYRINGE
PREFILLED_SYRINGE | INTRAVENOUS | Status: AC
  Filled 2021-04-03: qty 10

## 2021-04-03 MED ORDER — FENTANYL CITRATE (PF) 250 MCG/5ML IJ SOLN
INTRAMUSCULAR | Status: AC
  Filled 2021-04-03: qty 5

## 2021-04-03 MED ORDER — LIDOCAINE HCL (PF) 2 % IJ SOLN
INTRAMUSCULAR | Status: DC | PRN
  Administered 2021-04-03: 1.5 mg/kg/h via INTRADERMAL

## 2021-04-03 MED ORDER — PROPOFOL 500 MG/50ML IV EMUL
INTRAVENOUS | Status: DC | PRN
  Administered 2021-04-03: 25 ug/kg/min via INTRAVENOUS

## 2021-04-03 MED ORDER — AMISULPRIDE (ANTIEMETIC) 5 MG/2ML IV SOLN
INTRAVENOUS | Status: AC
  Filled 2021-04-03: qty 4

## 2021-04-03 MED ORDER — LEVETIRACETAM 250 MG PO TABS
125.0000 mg | ORAL_TABLET | Freq: Every day | ORAL | Status: DC
  Administered 2021-04-04: 125 mg via ORAL

## 2021-04-03 MED ORDER — FENTANYL CITRATE PF 50 MCG/ML IJ SOSY
PREFILLED_SYRINGE | INTRAMUSCULAR | Status: AC
  Filled 2021-04-03: qty 1

## 2021-04-03 MED ORDER — OXYCODONE HCL 5 MG PO TABS
ORAL_TABLET | ORAL | Status: AC
  Filled 2021-04-03: qty 1

## 2021-04-03 MED ORDER — DEXAMETHASONE SODIUM PHOSPHATE 10 MG/ML IJ SOLN
INTRAMUSCULAR | Status: AC
  Filled 2021-04-03: qty 1

## 2021-04-03 MED ORDER — PROPOFOL 10 MG/ML IV BOLUS
INTRAVENOUS | Status: DC | PRN
  Administered 2021-04-03: 200 mg via INTRAVENOUS

## 2021-04-03 MED ORDER — BUPROPION HCL ER (XL) 150 MG PO TB24
150.0000 mg | ORAL_TABLET | Freq: Every morning | ORAL | Status: DC
  Administered 2021-04-04: 150 mg via ORAL
  Filled 2021-04-03 (×2): qty 1

## 2021-04-03 MED ORDER — EPHEDRINE SULFATE-NACL 50-0.9 MG/10ML-% IV SOSY
PREFILLED_SYRINGE | INTRAVENOUS | Status: DC | PRN
  Administered 2021-04-03: 5 mg via INTRAVENOUS

## 2021-04-03 MED ORDER — DEXTROSE-NACL 5-0.45 % IV SOLN: INTRAVENOUS | Status: DC

## 2021-04-03 MED ORDER — MIDAZOLAM HCL 2 MG/2ML IJ SOLN
INTRAMUSCULAR | Status: AC
  Filled 2021-04-03: qty 2

## 2021-04-03 MED ORDER — ONDANSETRON HCL 4 MG/2ML IJ SOLN
INTRAMUSCULAR | Status: DC | PRN
  Administered 2021-04-03: 4 mg via INTRAVENOUS

## 2021-04-03 MED ORDER — STERILE WATER FOR IRRIGATION IR SOLN
Status: DC | PRN
  Administered 2021-04-03: 1000 mL

## 2021-04-03 MED ORDER — CEFAZOLIN SODIUM-DEXTROSE 2-4 GM/100ML-% IV SOLN
2.0000 g | Freq: Once | INTRAVENOUS | Status: AC
  Administered 2021-04-03: 2 g via INTRAVENOUS
  Filled 2021-04-03: qty 100

## 2021-04-03 MED ORDER — SODIUM CHLORIDE (PF) 0.9 % IJ SOLN
INTRAMUSCULAR | Status: DC | PRN
  Administered 2021-04-03: 20 mL

## 2021-04-03 MED ORDER — DOCUSATE SODIUM 100 MG PO CAPS
100.0000 mg | ORAL_CAPSULE | Freq: Two times a day (BID) | ORAL | Status: DC
  Administered 2021-04-03 – 2021-04-04 (×2): 100 mg via ORAL
  Filled 2021-04-03 (×2): qty 1

## 2021-04-03 MED ORDER — DIPHENHYDRAMINE HCL 12.5 MG/5ML PO ELIX
12.5000 mg | ORAL_SOLUTION | Freq: Four times a day (QID) | ORAL | Status: DC | PRN
  Filled 2021-04-03: qty 10

## 2021-04-03 MED ORDER — AMISULPRIDE (ANTIEMETIC) 5 MG/2ML IV SOLN
10.0000 mg | Freq: Once | INTRAVENOUS | Status: AC | PRN
  Administered 2021-04-03: 10 mg via INTRAVENOUS

## 2021-04-03 MED ORDER — LIDOCAINE 2% (20 MG/ML) 5 ML SYRINGE
INTRAMUSCULAR | Status: DC | PRN
  Administered 2021-04-03: 60 mg via INTRAVENOUS

## 2021-04-03 MED ORDER — SODIUM CHLORIDE (PF) 0.9 % IJ SOLN
INTRAMUSCULAR | Status: AC
  Filled 2021-04-03: qty 20

## 2021-04-03 MED ORDER — ROCURONIUM BROMIDE 10 MG/ML (PF) SYRINGE
PREFILLED_SYRINGE | INTRAVENOUS | Status: DC | PRN
  Administered 2021-04-03: 60 mg via INTRAVENOUS
  Administered 2021-04-03 (×2): 10 mg via INTRAVENOUS
  Administered 2021-04-03: 20 mg via INTRAVENOUS
  Administered 2021-04-03: 40 mg via INTRAVENOUS

## 2021-04-03 MED ORDER — DOCUSATE SODIUM 100 MG PO CAPS: 100.0000 mg | ORAL_CAPSULE | Freq: Two times a day (BID) | ORAL | Status: DC

## 2021-04-03 MED ORDER — LIDOCAINE HCL 2 % IJ SOLN
INTRAMUSCULAR | Status: AC
  Filled 2021-04-03: qty 20

## 2021-04-03 MED ORDER — MORPHINE SULFATE (PF) 4 MG/ML IV SOLN
2.0000 mg | INTRAVENOUS | Status: DC | PRN
  Administered 2021-04-03 – 2021-04-04 (×9): 4 mg via INTRAVENOUS
  Filled 2021-04-03 (×7): qty 1

## 2021-04-03 MED ORDER — OXYCODONE HCL 5 MG PO TABS
5.0000 mg | ORAL_TABLET | Freq: Once | ORAL | Status: AC | PRN
  Administered 2021-04-03: 5 mg via ORAL

## 2021-04-03 MED ORDER — LACTATED RINGERS IR SOLN
Status: DC | PRN
  Administered 2021-04-03: 3000 mL

## 2021-04-03 MED ORDER — ESCITALOPRAM OXALATE 5 MG PO TABS
5.0000 mg | ORAL_TABLET | Freq: Every morning | ORAL | Status: DC
  Administered 2021-04-04: 5 mg via ORAL
  Filled 2021-04-03 (×2): qty 1

## 2021-04-03 MED ORDER — CEFAZOLIN SODIUM-DEXTROSE 1-4 GM/50ML-% IV SOLN
1.0000 g | Freq: Three times a day (TID) | INTRAVENOUS | Status: AC
Start: 2021-04-03 — End: 2021-04-04
  Administered 2021-04-03 – 2021-04-04 (×2): 1 g via INTRAVENOUS
  Filled 2021-04-03 (×3): qty 50

## 2021-04-03 MED ORDER — BUPIVACAINE LIPOSOME 1.3 % IJ SUSP
INTRAMUSCULAR | Status: AC
  Filled 2021-04-03: qty 20

## 2021-04-03 MED ORDER — PROPOFOL 1000 MG/100ML IV EMUL
INTRAVENOUS | Status: AC
  Filled 2021-04-03: qty 100

## 2021-04-03 MED ORDER — PROMETHAZINE HCL 25 MG/ML IJ SOLN: 6.2500 mg | INTRAMUSCULAR | Status: DC | PRN

## 2021-04-03 MED ORDER — ACETAMINOPHEN 500 MG PO TABS
1000.0000 mg | ORAL_TABLET | Freq: Once | ORAL | Status: AC
  Administered 2021-04-03: 1000 mg via ORAL
  Filled 2021-04-03: qty 2

## 2021-04-03 MED ORDER — LAMOTRIGINE 150 MG PO TABS
150.0000 mg | ORAL_TABLET | Freq: Every day | ORAL | Status: DC
  Administered 2021-04-04: 150 mg via ORAL
  Filled 2021-04-03 (×2): qty 1

## 2021-04-03 MED ORDER — LEVETIRACETAM 250 MG PO TABS
250.0000 mg | ORAL_TABLET | Freq: Every day | ORAL | Status: DC
  Administered 2021-04-03: 250 mg via ORAL
  Filled 2021-04-03 (×2): qty 1

## 2021-04-03 MED ORDER — MIDAZOLAM HCL 5 MG/5ML IJ SOLN
INTRAMUSCULAR | Status: DC | PRN
  Administered 2021-04-03: 2 mg via INTRAVENOUS

## 2021-04-03 MED ORDER — FENTANYL CITRATE (PF) 100 MCG/2ML IJ SOLN
INTRAMUSCULAR | Status: DC | PRN
  Administered 2021-04-03 (×9): 50 ug via INTRAVENOUS

## 2021-04-03 MED ORDER — DIPHENHYDRAMINE HCL 50 MG/ML IJ SOLN
12.5000 mg | Freq: Four times a day (QID) | INTRAMUSCULAR | Status: DC | PRN
  Administered 2021-04-04: 25 mg via INTRAVENOUS
  Filled 2021-04-03: qty 1

## 2021-04-03 MED ORDER — ONDANSETRON HCL 4 MG/2ML IJ SOLN
INTRAMUSCULAR | Status: AC
  Filled 2021-04-03: qty 2

## 2021-04-03 SURGICAL SUPPLY — 54 items
APPLICATOR SURGIFLO ENDO (HEMOSTASIS) ×2 IMPLANT
BAG COUNTER SPONGE SURGICOUNT (BAG) IMPLANT
CHLORAPREP W/TINT 26 (MISCELLANEOUS) ×2 IMPLANT
CLIP LIGATING HEMO O LOK GREEN (MISCELLANEOUS) ×6 IMPLANT
CLIP LIGATING HEMOLOK MED (MISCELLANEOUS) ×2 IMPLANT
COVER SURGICAL LIGHT HANDLE (MISCELLANEOUS) ×2 IMPLANT
COVER TIP SHEARS 8 DVNC (MISCELLANEOUS) ×1 IMPLANT
COVER TIP SHEARS 8MM DA VINCI (MISCELLANEOUS) ×1
DECANTER SPIKE VIAL GLASS SM (MISCELLANEOUS) ×2 IMPLANT
DERMABOND ADVANCED (GAUZE/BANDAGES/DRESSINGS) ×1
DERMABOND ADVANCED .7 DNX12 (GAUZE/BANDAGES/DRESSINGS) ×1 IMPLANT
DRAIN CHANNEL 15F RND FF 3/16 (WOUND CARE) ×2 IMPLANT
DRAPE ARM DVNC X/XI (DISPOSABLE) ×4 IMPLANT
DRAPE COLUMN DVNC XI (DISPOSABLE) ×1 IMPLANT
DRAPE DA VINCI XI ARM (DISPOSABLE) ×4
DRAPE DA VINCI XI COLUMN (DISPOSABLE) ×1
DRAPE INCISE IOBAN 66X45 STRL (DRAPES) ×2 IMPLANT
DRAPE SHEET LG 3/4 BI-LAMINATE (DRAPES) ×2 IMPLANT
ELECT PENCIL ROCKER SW 15FT (MISCELLANEOUS) ×2 IMPLANT
ELECT REM PT RETURN 15FT ADLT (MISCELLANEOUS) ×2 IMPLANT
EVACUATOR SILICONE 100CC (DRAIN) ×2 IMPLANT
GAUZE 4X4 16PLY ~~LOC~~+RFID DBL (SPONGE) ×2 IMPLANT
GLOVE SURG ENC MOIS LTX SZ6.5 (GLOVE) ×2 IMPLANT
GLOVE SURG ENC TEXT LTX SZ7.5 (GLOVE) ×4 IMPLANT
GOWN STRL REUS W/TWL LRG LVL3 (GOWN DISPOSABLE) ×6 IMPLANT
HEMOSTAT SURGICEL 4X8 (HEMOSTASIS) IMPLANT
HOLDER FOLEY CATH W/STRAP (MISCELLANEOUS) ×2 IMPLANT
IRRIG SUCT STRYKERFLOW 2 WTIP (MISCELLANEOUS) ×2
IRRIGATION SUCT STRKRFLW 2 WTP (MISCELLANEOUS) ×1 IMPLANT
KIT BASIN OR (CUSTOM PROCEDURE TRAY) ×2 IMPLANT
KIT TURNOVER KIT A (KITS) ×2 IMPLANT
POUCH SPECIMEN RETRIEVAL 10MM (ENDOMECHANICALS) ×2 IMPLANT
PROTECTOR NERVE ULNAR (MISCELLANEOUS) ×4 IMPLANT
SEAL CANN UNIV 5-8 DVNC XI (MISCELLANEOUS) ×4 IMPLANT
SEAL XI 5MM-8MM UNIVERSAL (MISCELLANEOUS) ×4
SET TUBE SMOKE EVAC HIGH FLOW (TUBING) ×2 IMPLANT
SOLUTION ELECTROLUBE (MISCELLANEOUS) ×2 IMPLANT
SURGIFLO W/THROMBIN 8M KIT (HEMOSTASIS) ×2 IMPLANT
SUT ETHILON 3 0 PS 1 (SUTURE) ×2 IMPLANT
SUT MNCRL AB 4-0 PS2 18 (SUTURE) ×4 IMPLANT
SUT V-LOC BARB 180 2/0GR6 GS22 (SUTURE) ×2
SUT VIC AB 0 CT1 27 (SUTURE) ×1
SUT VIC AB 0 CT1 27XBRD ANTBC (SUTURE) ×1 IMPLANT
SUT VICRYL 0 27 CT2 27 ABS (SUTURE) ×4 IMPLANT
SUT VICRYL 0 UR6 27IN ABS (SUTURE) IMPLANT
SUT VLOC BARB 180 ABS3/0GR12 (SUTURE) ×4
SUTURE V-LC BRB 180 2/0GR6GS22 (SUTURE) ×1 IMPLANT
SUTURE VLOC BRB 180 ABS3/0GR12 (SUTURE) ×2 IMPLANT
TOWEL OR 17X26 10 PK STRL BLUE (TOWEL DISPOSABLE) ×2 IMPLANT
TOWEL OR NON WOVEN STRL DISP B (DISPOSABLE) ×2 IMPLANT
TRAY FOLEY MTR SLVR 16FR STAT (SET/KITS/TRAYS/PACK) ×2 IMPLANT
TRAY LAPAROSCOPIC (CUSTOM PROCEDURE TRAY) ×2 IMPLANT
TROCAR XCEL 12X100 BLDLESS (ENDOMECHANICALS) ×2 IMPLANT
WATER STERILE IRR 1000ML POUR (IV SOLUTION) ×2 IMPLANT

## 2021-04-03 NOTE — Op Note (Signed)
Preoperative diagnosis: Left renal neoplasm  Postoperative diagnosis: Left renal neoplasm  Procedure:  Left robotic-assisted laparoscopic partial nephrectomy Intraoperative renal ultrasonography  Surgeon: Pryor Curia. M.D.  Assistant(s): Debbrah Alar, PA-C  An assistant was required for this surgical procedure.  The duties of the assistant included but were not limited to suctioning, passing suture, camera manipulation, retraction. This procedure would not be able to be performed without an Environmental consultant.  Anesthesia: General  Complications: None  EBL: 100 mL  IVF:  1700 mL crystalloid  Specimens: Left renal neoplasm  Disposition of specimens: Pathology  Intraoperative findings:       1. Warm renal ischemia time: 19 minutes       2. Intraoperative renal ultrasound findings: There was a 2.4 cm heterogenous mass noted off the anterior aspect of the upper pole of the left kidney.  Drains: # 15 Blake perinephric drain  Indication:  Paul Russo is a 35 y.o. year old patient with a left renal neoplasm.  After a thorough review of the management options for their renal mass, they elected to proceed with surgical treatment and the above procedure.  We have discussed the potential benefits and risks of the procedure, side effects of the proposed treatment, the likelihood of the patient achieving the goals of the procedure, and any potential problems that might occur during the procedure or recuperation. Informed consent has been obtained.   Description of procedure:  The patient was taken to the operating room and a general anesthetic was administered. The patient was given preoperative antibiotics, placed in the left modified flank position with care to pad all potential pressure points, and prepped and draped in the usual sterile fashion. Next a preoperative timeout was performed.  A site was selected in the upper midline for initial port placement. This was placed using a  standard open Hassan technique which allowed entry into the peritoneal cavity under direct vision and without difficulty. A 12 mm port was placed and a pneumoperitoneum established. The camera was then used to inspect the abdomen and there was no evidence of any intra-abdominal injuries or other abnormalities. The remaining abdominal ports were then placed. 8 mm robotic ports were placed in the left upper quadrant, left lower quadrant, and far left lateral abdominal wall. A 8 mm port was placed to the left of the midline just off the rectus muscle for the camera.. All ports were placed under direct vision without difficulty. The surgical cart was then docked.   Utilizing the cautery scissors, the white line of Toldt was incised allowing the colon to be mobilized medially and the plane between the mesocolon and the anterior layer of Gerota's fascia to be developed and the kidney to be exposed.  The ureter and gonadal vein were identified inferiorly and the ureter was lifted anteriorly off the psoas muscle.  Dissection proceeded superiorly along the gonadal vein until the renal vein was identified.  The renal hilum was then carefully isolated with a combination of blunt and sharp dissection allowing the renal arterial and venous structures to be separated and isolated in preparation for renal hilar vessel clamping. There was a single renal artery and single renal vein.   Attention turned to the kidney and the perinephric fat surrounding the renal mass was removed and the kidney was mobilized sufficiently for exposure and resection of the renal mass.   Intraoperative renal ultrasonography was utilized with the laparoscopic ultrasound probe to identify the renal tumor and identify the tumor margins.  Once the renal mass was properly isolated, preparations were made for resection of the tumor.  Reconstructive sutures were placed into the abdomen for the renorrhaphy portion of the procedure.  The renal artery was  then clamped with bulldog clamps.  The tumor was then excised with cold scissor dissection along with an adequate visible gross margin of normal renal parenchyma. The tumor appeared to be excised without any gross violation of the tumor. The renal collecting system was entered during removal of the tumor.  A running 3-0 V-lock suture was then brought through the capsule of the kidney and run along the base of the renal defect to provide hemostasis and close any entry into the renal collecting system if present. Weck clips were used to secure this suture outside the renal capsule at the proximal and distal ends. An additional hemostatic agent (Surgiflo) was then placed into the renal defect. A running 2-0 V lock suture was then used to close the renal capsule using a sliding clip technique which resulted in excellent compression of the renal defect.    The bulldog clamps were then removed from the renal hilar vessel(s). Total warm renal ischemia time was 19 minutes. The renal tumor resection site was examined. Hemostasis appeared adequate.   The kidney was placed back into its normal anatomic position and covered with perinephric fat as needed.  A # 65 Blake drain was then brought through the lateral lower port site and positioned in the perinephric space.  It was secured to the skin with a nylon suture. The surgical cart was undocked.  The renal tumor specimen was removed intact within an endopouch retrieval bag via the upper midline port site.  All other laparoscopic/robotic ports had been removed under direct vision and the pneumoperitoneum let down with inspection of the operative field performed and hemostasis again confirmed. The upper midline incision was then closed at the fascial level with 0-PDS suture. All incision sites were then injected with local anesthetic and reapproximated at the skin level with 4-0 monocryl subcuticular closures.  Dermabond was applied to the skin.  The patient tolerated the  procedure well and without complications.  The patient was able to be extubated and transferred to the recovery unit in satisfactory condition.  Pryor Curia MD

## 2021-04-03 NOTE — Anesthesia Procedure Notes (Signed)
Procedure Name: Intubation Date/Time: 04/03/2021 7:30 AM Performed by: Claudia Desanctis, CRNA Pre-anesthesia Checklist: Patient identified, Emergency Drugs available, Suction available and Patient being monitored Patient Re-evaluated:Patient Re-evaluated prior to induction Oxygen Delivery Method: Circle system utilized Preoxygenation: Pre-oxygenation with 100% oxygen Induction Type: IV induction Ventilation: Mask ventilation without difficulty Laryngoscope Size: 2 and Miller Grade View: Grade I Tube type: Oral Tube size: 7.5 mm Number of attempts: 1 Airway Equipment and Method: Stylet Placement Confirmation: ETT inserted through vocal cords under direct vision, positive ETCO2 and breath sounds checked- equal and bilateral Secured at: 22 cm Tube secured with: Tape Dental Injury: Teeth and Oropharynx as per pre-operative assessment

## 2021-04-03 NOTE — Discharge Instructions (Signed)

## 2021-04-03 NOTE — Interval H&P Note (Signed)
History and Physical Interval Note:  04/03/2021 6:43 AM  Paul Russo  has presented today for surgery, with the diagnosis of LEFT RENAL NEOPLASM.  The various methods of treatment have been discussed with the patient and family. After consideration of risks, benefits and other options for treatment, the patient has consented to  Procedure(s): XI ROBOTIC ASSITED LAPAROSCOPIC PARTIAL NEPHRECTOMY (Left) as a surgical intervention.  The patient's history has been reviewed, patient examined, no change in status, stable for surgery.  I have reviewed the patient's chart and labs.  Questions were answered to the patient's satisfaction.     [employer]

## 2021-04-03 NOTE — Transfer of Care (Signed)
Immediate Anesthesia Transfer of Care Note  Patient: Paul Russo  Procedure(s) Performed: XI ROBOTIC ASSITED LAPAROSCOPIC PARTIAL NEPHRECTOMY (Left)  Patient Location: PACU  Anesthesia Type:General  Level of Consciousness: awake and patient cooperative  Airway & Oxygen Therapy: Patient Spontanous Breathing and Patient connected to face mask  Post-op Assessment: Report given to RN and Post -op Vital signs reviewed and stable  Post vital signs: Reviewed and stable  Last Vitals:  Vitals Value Taken Time  BP 136/85 04/03/21 1024  Temp    Pulse 72 04/03/21 1025  Resp 15 04/03/21 1025  SpO2 95 % 04/03/21 1025  Vitals shown include unvalidated device data.  Last Pain:  Vitals:   04/03/21 0608  TempSrc: Oral         Complications: No notable events documented.

## 2021-04-03 NOTE — Progress Notes (Signed)
Patient ID: Paul Russo, male   DOB: [birth year], 35 y.o.   MRN: [id number]  Post-op note  Subjective: The patient is doing well.  No complaints. Pain controlled.  Objective: Vital signs in last 24 hours: Temp:  [97.8 F (36.6 C)-98.8 F (37.1 C)] 98.6 F (37 C) (10/10 1555) Pulse Rate:  [76-88] 80 (10/10 1555) Resp:  [13-20] 20 (10/10 1555) BP: (109-169)/(58-94) 124/69 (10/10 1555) SpO2:  [94 %-100 %] 94 % (10/10 1555)  Intake/Output from previous day: No intake/output data recorded. Intake/Output this shift: Total I/O In: 2400 [I.V.:2300; IV Piggyback:100] Out: 2040 [Urine:1850; Drains:90; Blood:100]  Physical Exam:  General: Alert and oriented. Abdomen: Soft, Nondistended. Incisions: Clean and dry. GU: Urine clearing.  Lab Results: Recent Labs    04/03/21 1034  HGB 15.6  HCT 47.4    Assessment/Plan: POD#0   1) Continue to monitor, bedrest tonight   Pryor Curia. MD   LOS: 0 days   Dutch Gray 04/03/2021, 5:41 PM

## 2021-04-03 NOTE — Anesthesia Postprocedure Evaluation (Signed)
Anesthesia Post Note  Patient: Paul Russo  Procedure(s) Performed: XI ROBOTIC ASSITED LAPAROSCOPIC PARTIAL NEPHRECTOMY (Left)     Patient location during evaluation: PACU Anesthesia Type: General Level of consciousness: awake and alert Pain management: pain level controlled Vital Signs Assessment: post-procedure vital signs reviewed and stable Respiratory status: spontaneous breathing, nonlabored ventilation, respiratory function stable and patient connected to nasal cannula oxygen Cardiovascular status: blood pressure returned to baseline and stable Postop Assessment: no apparent nausea or vomiting Anesthetic complications: no   No notable events documented.  Last Vitals:  Vitals:   04/03/21 1500 04/03/21 1555  BP: (!) 146/75 124/69  Pulse:  80  Resp: 20 20  Temp: 37.1 C 37 C  SpO2: 98% 94%    Last Pain:  Vitals:   04/03/21 1555  TempSrc: Oral  PainSc:                  [name] P [name]

## 2021-04-04 ENCOUNTER — Encounter (HOSPITAL_COMMUNITY): Payer: Self-pay | Admitting: Urology

## 2021-04-04 DIAGNOSIS — C642 Malignant neoplasm of left kidney, except renal pelvis: Secondary | ICD-10-CM | POA: Diagnosis not present

## 2021-04-04 LAB — CREATININE, FLUID (PLEURAL, PERITONEAL, JP DRAINAGE): Creat, Fluid: 1.1 mg/dL

## 2021-04-04 LAB — BASIC METABOLIC PANEL
Anion gap: 4 — ABNORMAL LOW (ref 5–15)
BUN: 11 mg/dL (ref 6–20)
CO2: 31 mmol/L (ref 22–32)
Calcium: 8.4 mg/dL — ABNORMAL LOW (ref 8.9–10.3)
Chloride: 103 mmol/L (ref 98–111)
Creatinine, Ser: 1.3 mg/dL — ABNORMAL HIGH (ref 0.61–1.24)
GFR, Estimated: >60 mL/min (ref >60)
Glucose, Bld: 163 mg/dL — ABNORMAL HIGH (ref 70–99)
Potassium: 4.1 mmol/L (ref 3.5–5.1)
Sodium: 138 mmol/L (ref 135–145)

## 2021-04-04 LAB — HEMOGLOBIN AND HEMATOCRIT, BLOOD
HCT: 43.1 % (ref 39.0–52.0)
Hemoglobin: 14.3 g/dL (ref 13.0–17.0)

## 2021-04-04 MED ORDER — CALCIUM CARBONATE ANTACID 500 MG PO CHEW
1.0000 | CHEWABLE_TABLET | ORAL | Status: DC | PRN
  Administered 2021-04-04: 200 mg via ORAL
  Filled 2021-04-04: qty 1

## 2021-04-04 MED ORDER — CHLORHEXIDINE GLUCONATE CLOTH 2 % EX PADS: 6.0000 | MEDICATED_PAD | Freq: Every day | CUTANEOUS | Status: DC

## 2021-04-04 NOTE — Discharge Summary (Signed)
  Date of admission: 04/03/2021  Date of discharge: 04/04/2021  Admission diagnosis: Left renal neoplasm  Discharge diagnosis: Renal cell carcinoma  Secondary diagnoses: None  History and Physical: For full details, please see admission history and physical. Briefly, Paul Russo is a 35 y.o. year old patient with an incidentally detected small left renal mass concerning for malignancy.   Hospital Course: He underwent a left RAL partial nephrectomy on 04/03/21.  He recovered uneventfully and was monitored overnight.  He remained hemodynamically stable and his renal function stabilized by POD # 1.  His catheter was removed, he ambulated, and he was transitioned to oral pain medication.  His drain Cr was 1.1 consistent with serum and was removed.  He was stable for discharge home on POD # 1.  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  This was discussed with him before discharge.  Laboratory values:  Recent Labs    04/03/21 1034 04/04/21 0409  HGB 15.6 14.3  HCT 47.4 43.1   Recent Labs    04/03/21 1034 04/04/21 0409  CREATININE 1.63* 1.30*    Disposition: Home  Discharge instruction: The patient was instructed to be ambulatory but told to refrain from heavy lifting, strenuous activity, or driving.   Discharge medications:  Allergies as of 04/04/2021       Reactions   Shellfish Allergy Other (See Comments)   Scratchy throat        Medication List     STOP taking these medications    Cholecalciferol 50 MCG (2000 UT) Tabs   Daily Vites tablet       TAKE these medications    buPROPion 150 MG 24 hr tablet Commonly known as: WELLBUTRIN XL Take 150 mg by mouth every morning.   clomiPHENE 50 MG tablet Commonly known as: CLOMID Take 25 mg by mouth See admin instructions. Take Mon., wed.,  fri and Saturday Only   docusate sodium 100 MG capsule Commonly known as: COLACE Take 1 capsule  (100 mg total) by mouth 2 (two) times daily.   escitalopram 5 MG tablet Commonly known as: LEXAPRO Take 5 mg by mouth every morning.   lamoTRIgine 150 MG tablet Commonly known as: LAMICTAL Take 150 mg by mouth daily.   levETIRAcetam 250 MG tablet Commonly known as: KEPPRA Take 125-250 mg by mouth See admin instructions. Take 125 mg in the morning and 250 mg at bedtime   traMADol 50 MG tablet Commonly known as: Ultram Take 1-2 tablets (50-100 mg total) by mouth every 6 (six) hours as needed for moderate pain or severe pain.        Followup:   Follow-up Information     Raynelle Bring, MD Follow up on 04/11/2021.   Specialty: Urology Why: at 10:30  - Just call me when you feel up to coming in the office.  I think they put you down for an actual appt but you just let me know whenever you want to come in over the next 2 weeks. Contact information: [place] [address] [phone]

## 2021-04-04 NOTE — Progress Notes (Signed)
Patient discharged home.  IV removed - WNL.  Reviewed AVS and medications with patient and wife - verbalized understanding.  No questions at this time, patient assisted off unit via WC in NAD.

## 2021-04-04 NOTE — Progress Notes (Signed)
Patient ID: Paul Russo, male   DOB: Jun 25, 1986, 35 y.o.   MRN: [id number]  1 Day Post-Op Subjective: Doing well.  No nausea or vomiting overnight.  Pain controlled. Passing flatus.  Objective: Vital signs in last 24 hours: Temp:  [97.8 F (36.6 C)-98.9 F (37.2 C)] 98.8 F (37.1 C) (10/11 0416) Pulse Rate:  [64-88] 71 (10/11 0416) Resp:  [13-20] 20 (10/11 0416) BP: (109-169)/(58-94) 110/67 (10/11 0416) SpO2:  [94 %-99 %] 97 % (10/11 0416) Weight:  [108.7 kg] 108.7 kg (10/11 0424)  Intake/Output from previous day: 10/10 0701 - 10/11 0700 In: 4767.7 [P.O.:1614; I.V.:2748.3; IV Piggyback:405.4] Out: 7100 [Urine:6850; Drains:150; Blood:100] Intake/Output this shift: No intake/output data recorded.  Physical Exam:  General: Alert and oriented CV: RRR Lungs: Clear Abdomen: Soft, ND, positive BS Incisions: C/D/I GU: Urine clear  Lab Results: Recent Labs    04/03/21 1034 04/04/21 0409  HGB 15.6 14.3  HCT 47.4 43.1   BMET Recent Labs    04/03/21 1034 04/04/21 0409  NA 137 138  K 5.0 4.1  CL 106 103  CO2 27 31  GLUCOSE 108* 163*  BUN 14 11  CREATININE 1.63* 1.30*  CALCIUM 8.6* 8.4*     Studies/Results: No results found.  Assessment/Plan: POD # 1 s/p left RAL partial nephrectomy - Ambulate, IS - Check drain Cr - D/C Foley - Regular diet - Oral pain medication - Renal function stable compared to pre-op - Pathology pending - Will reassess for discharge later today   LOS: 0 days   Dutch Gray 04/04/2021, 7:21 AM

## 2021-04-05 LAB — SURGICAL PATHOLOGY

## 2021-04-11 ENCOUNTER — Encounter: Payer: Self-pay | Admitting: Neurology

## 2021-04-11 ENCOUNTER — Ambulatory Visit (INDEPENDENT_AMBULATORY_CARE_PROVIDER_SITE_OTHER): Payer: No Typology Code available for payment source | Admitting: Neurology

## 2021-04-11 ENCOUNTER — Other Ambulatory Visit: Payer: Self-pay

## 2021-04-11 VITALS — BP 114/69 | HR 67 | Ht 70.0 in | Wt 227.6 lb

## 2021-04-11 DIAGNOSIS — E669 Obesity, unspecified: Secondary | ICD-10-CM | POA: Diagnosis not present

## 2021-04-11 DIAGNOSIS — R351 Nocturia: Secondary | ICD-10-CM

## 2021-04-11 DIAGNOSIS — R0683 Snoring: Secondary | ICD-10-CM | POA: Diagnosis not present

## 2021-04-11 DIAGNOSIS — G2581 Restless legs syndrome: Secondary | ICD-10-CM | POA: Diagnosis not present

## 2021-04-11 DIAGNOSIS — R7989 Other specified abnormal findings of blood chemistry: Secondary | ICD-10-CM

## 2021-04-11 DIAGNOSIS — G4719 Other hypersomnia: Secondary | ICD-10-CM

## 2021-04-11 DIAGNOSIS — G4761 Periodic limb movement disorder: Secondary | ICD-10-CM

## 2021-04-11 DIAGNOSIS — G479 Sleep disorder, unspecified: Secondary | ICD-10-CM

## 2021-04-11 DIAGNOSIS — R634 Abnormal weight loss: Secondary | ICD-10-CM

## 2021-04-11 DIAGNOSIS — R519 Headache, unspecified: Secondary | ICD-10-CM

## 2021-04-11 NOTE — Progress Notes (Signed)
Subjective:    Patient ID: Paul Russo is a 35 y.o. male.  HPI    Star Age, MD, PhD [facility] [address] [place], [place] [address]  Dear Dr. Ronnald Ramp,   I saw your patient, Paul Russo, upon your kind request, in my sleep clinic today for initial consultation of his sleep disorder, in particular, concern for underlying obstructive sleep apnea.  The patient is accompanied by his wife today.  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  He was diagnosed with low testosterone and has been on clomiphene.  His wife has noticed that he is a restless sleeper and has twitching in his legs while asleep.  She has noticed the twitching in his legs consistently for the past 2 years.  Of note, he is followed by psychiatry for his mood.  He is currently on Lamictal once daily in the morning and Keppra 125 mg in the morning and 250 mg at bedtime.  He has been on these medications for years.  He also takes Wellbutrin once daily.  Bedtime is generally around 10 and rise time currently around 7.  He is recuperating fairly well since his surgery about 8 days ago and is scheduled to go back to work in about a week.  He has 2 children, ages 51 and 36, 1 cat in the household, no TV in the bedroom.  I reviewed your office note from 09/19/2020.  His Epworth sleepiness score is 9 out of 24, fatigue severity score is 42 out of 63. He has woken up occasionally with a headache and sometimes takes ibuprofen for headaches.  He has nocturia about once per average night.  He has seen Dr. Nelva Bush for lumbar radiculopathy and has received an injection which helped.  He drinks caffeine in the form of soda, about 3-4 servings per day  and 1 or 2 cups of coffee, typically no caffeine after 1 PM on average.  He does not drink alcohol, he quit smoking some 10 years ago.  He is not aware of any family history of sleep apnea.  In the recent past he has lost weight and per wife, his snoring has indeed improved.  He denies telltale symptoms of restless leg syndrome.  Of note, he is on low-dose Lexapro 5 mg once daily.  His Past Medical History Is Significant For: Past Medical History:  Diagnosis Date   Depression    Hyperlipidemia    Hypertension     His Past Surgical History Is Significant For: Past Surgical History:  Procedure Laterality Date   KNEE SURGERY     ROBOTIC ASSITED PARTIAL NEPHRECTOMY Left 04/03/2021   Procedure: XI ROBOTIC ASSITED LAPAROSCOPIC PARTIAL NEPHRECTOMY;  Surgeon: Raynelle Bring, MD;  Location: [facility];  Service: Urology;  Laterality: Left;   WISDOM TOOTH EXTRACTION      His Family History Is Significant For: History reviewed. No pertinent family history.  His Social History Is Significant For: Social History   Socioeconomic History   Marital status: Married    Spouse name: Not on file   Number of children: Not on file   Years of education: Not on file   Highest education level: Not on file  Occupational History   Not on file  Tobacco Use   Smoking  status: Former    Types: Cigarettes    Quit date: 2016    Years since quitting: 6.8   Smokeless tobacco: Never  Vaping Use   Vaping Use: Never used  Substance and Sexual Activity   Alcohol use: Not Currently   Drug use: Never   Sexual activity: Yes    Partners: Female    Birth control/protection: None  Other Topics Concern   Not on file  Social History Narrative   Caffeine 3 cups daily.  Urologist,  Education: MD. Works for [employer].   Social Determinants of Health   Financial Resource Strain: Not on file  Food Insecurity: Not on file  Transportation Needs: Not on file  Physical Activity: Not on file  Stress: Not on file   Social Connections: Not on file    His Allergies Are:  Allergies  Allergen Reactions   Shellfish Allergy Other (See Comments)    Scratchy throat  :   His Current Medications Are:  Outpatient Encounter Medications as of 04/11/2021  Medication Sig   buPROPion (WELLBUTRIN XL) 150 MG 24 hr tablet Take 150 mg by mouth every morning.   clomiPHENE (CLOMID) 50 MG tablet Take 25 mg by mouth See admin instructions. Take Mon., wed.,  fri and Saturday Only   docusate sodium (COLACE) 100 MG capsule Take 1 capsule (100 mg total) by mouth 2 (two) times daily.   escitalopram (LEXAPRO) 5 MG tablet Take 5 mg by mouth every morning.   lamoTRIgine (LAMICTAL) 150 MG tablet Take 150 mg by mouth daily.   levETIRAcetam (KEPPRA) 250 MG tablet Take 125-250 mg by mouth See admin instructions. Take 125 mg in the morning and 250 mg at bedtime   traMADol (ULTRAM) 50 MG tablet Take 1-2 tablets (50-100 mg total) by mouth every 6 (six) hours as needed for moderate pain or severe pain.   No facility-administered encounter medications on file as of 04/11/2021.  :   Review of Systems:  Out of a complete 14 point review of systems, all are reviewed and negative with the exception of these symptoms as listed below:   Review of Systems  Neurological:        Snoring ,fatigue, restless leg . ESS 9. FSS 42.   Objective:  Neurological Exam  Physical Exam Physical Examination:   Vitals:   04/11/21 0912  BP: 114/69  Pulse: 67    General Examination: The patient is a very pleasant 35 y.o. male in no acute distress. He appears well-developed and well-nourished and well groomed.   HEENT: Normocephalic, atraumatic, pupils are equal, round and reactive to light, extraocular tracking is good without limitation to gaze excursion or nystagmus noted. Hearing is grossly intact. Face is symmetric with normal facial animation. Speech is clear with no dysarthria noted. There is no hypophonia. There is no lip, neck/head, jaw  or voice tremor. Neck is supple with full range of passive and active motion. There are no carotid bruits on auscultation. Oropharynx exam reveals: mild mouth dryness, good dental hygiene and mild to moderate airway crowding, due to larger uvula, tonsillar size of about 1+ bilaterally, Mallampati class II.  Neck circumference of 17 1 8  inches.  He has a minimal overbite.  Tongue protrudes centrally and palate elevates symmetrically.  Nasal inspection reveals no mucosal swelling but narrow nasal passages and mild deviation of the septum to the right.  Chest: Clear to auscultation without wheezing, rhonchi or crackles noted.  Heart: S1+S2+0, regular and normal without murmurs, rubs or  gallops noted.   Abdomen: Soft, non-tender and non-distended.  Extremities: There is no pitting edema in the distal lower extremities bilaterally.   Skin: Warm and dry without trophic changes noted.   Musculoskeletal: exam reveals no obvious joint deformities, tenderness or joint swelling or erythema.   Neurologically:  Mental status: The patient is awake, alert and oriented in all 4 spheres. His immediate and remote memory, attention, language skills and fund of knowledge are appropriate. There is no evidence of aphasia, agnosia, apraxia or anomia. Speech is clear with normal prosody and enunciation. Thought process is linear. Mood is normal and affect is normal.  Cranial nerves II - XII are as described above under HEENT exam.  Motor exam: Normal bulk, strength and tone is noted. There is no tremor, fine motor skills and coordination: grossly intact.  Cerebellar testing: No dysmetria or intention tremor. There is no truncal or gait ataxia.  Sensory exam: intact to light touch in the upper and lower extremities.  Gait, station and balance: He stands slightly slowly, he walks slowly, no limp.                Assessment and Plan:  In summary, Paul Russo is a very pleasant 35 y.o.-year old male with an underlying  medical history of hypertension, hyperlipidemia, depression, renal cell cancer with status post partial left nephrectomy on 04/03/2021, and obesity, whose history and physical exam concerning for obstructive sleep apnea (OSA). I had a long chat with the patient and his wife about my findings and the diagnosis of OSA, its prognosis and treatment options. We talked about medical treatments, surgical interventions and non-pharmacological approaches. I explained in particular the risks and ramifications of untreated moderate to severe OSA, especially with respect to developing cardiovascular disease down the Road, including congestive heart failure, difficult to treat hypertension, cardiac arrhythmias, or stroke. Even type 2 diabetes has, in part, been linked to untreated OSA. Symptoms of untreated OSA include daytime sleepiness, memory problems, mood irritability and mood disorder such as depression and anxiety, lack of energy, as well as recurrent headaches, especially morning headaches. We talked about trying to maintain a healthy lifestyle in general, as well as the importance of weight control. We also talked about the importance of good sleep hygiene. I recommended the following at this time: sleep study.  I outlined the difference between a laboratory attended sleep study versus home sleep test.  I explained the sleep test procedure to the patient and also outlined possible surgical and non-surgical treatment options of OSA, including the use of a custom-made dental device (which would require a referral to a specialist dentist or oral surgeon), upper airway surgical options, such as traditional UPPP or a novel less invasive surgical option in the form of Inspire hypoglossal nerve stimulation (which would involve a referral to an ENT surgeon). I also explained the CPAP treatment option to the patient, who indicated that he would be willing to try CPAP if the need arises. I explained the importance of being  compliant with PAP treatment, not only for insurance purposes but primarily to improve His symptoms, and for the patient's long term health benefit, including to reduce His cardiovascular risks. I answered all their questions today and the patient and his wife were in agreement. I plan to see him back after the sleep study is completed and encouraged him to call with any interim questions, concerns, problems or updates.   Thank you very much for allowing me to participate in the care  of this nice patient. If I can be of any further assistance to you please do not hesitate to call me at [phone].  Sincerely,   Star Age, MD, PhD

## 2021-04-11 NOTE — Patient Instructions (Signed)

## 2021-05-14 IMAGING — CT CT WRIST*R* W/O CM
3 of 8 series · 7 of 33 positions shown, 9 images · non-contrast
Comparison: None.

CLINICAL DATA: Medial wrist pain. Fall 06/20/2019

EXAM:
CT OF THE RIGHT WRIST WITHOUT CONTRAST
TECHNIQUE: Multidetector CT imaging of the right wrist was performed according
to the standard protocol. Multiplanar CT image reconstructions were
also generated.

[Series 4: ax axial bone · axial · 0.19mm/px · z∈[-1766,-1766]mm · 1 of 124 slices shown, 2 images]
[im 62/124  soft-tissue]
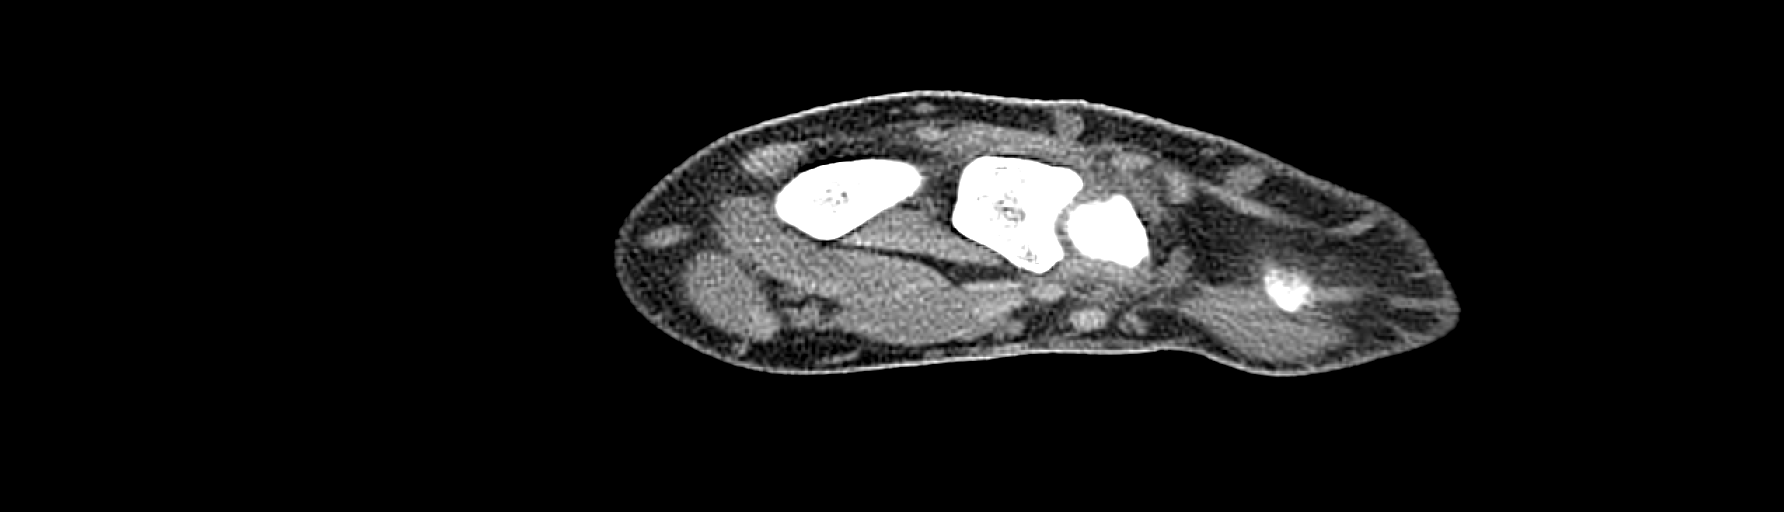
[im 62/124  bone]
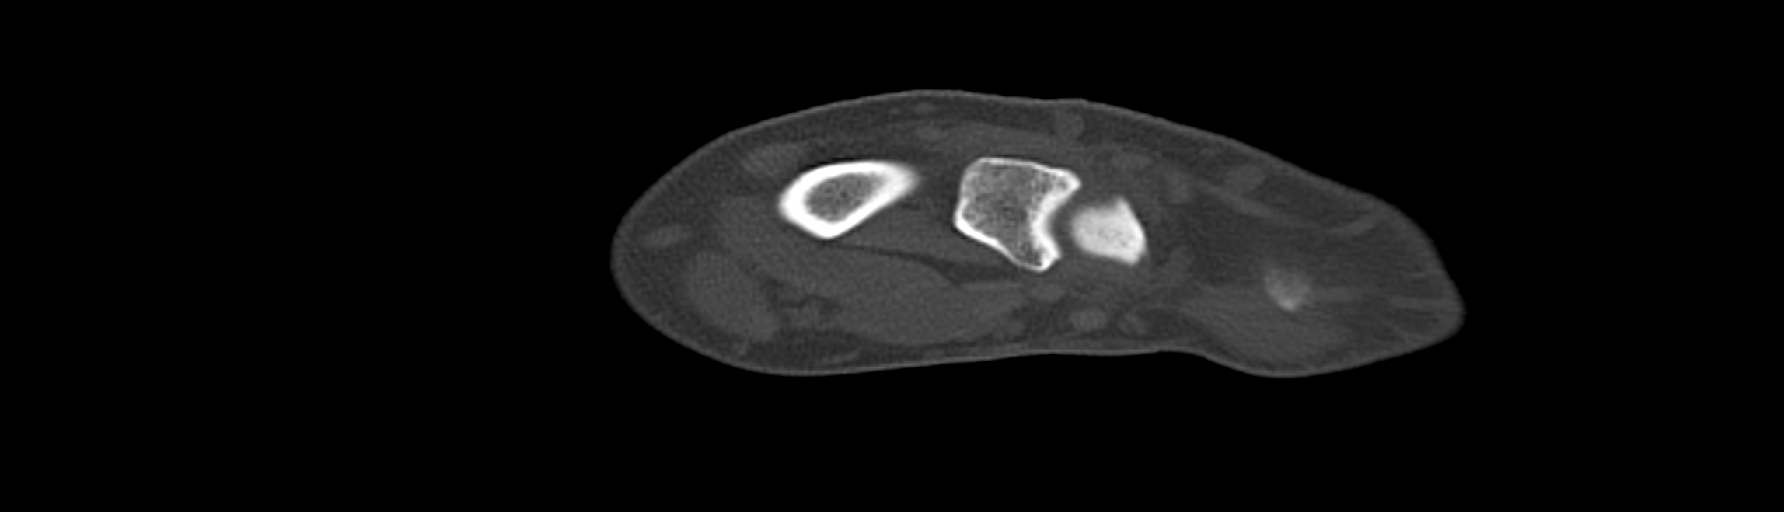

[Series 6: sag axial bone · sagittal · 0.20mm/px · 5 of 198 slices shown, 6 images]
[im 66/198  bone]
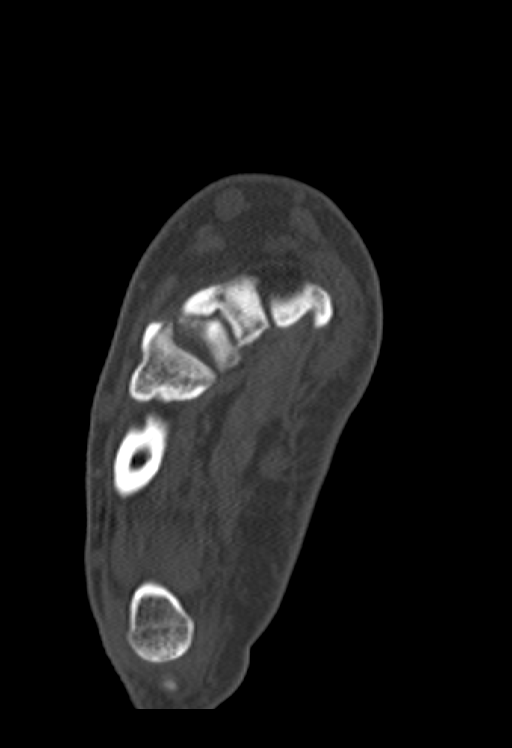
[im 83/198  bone]
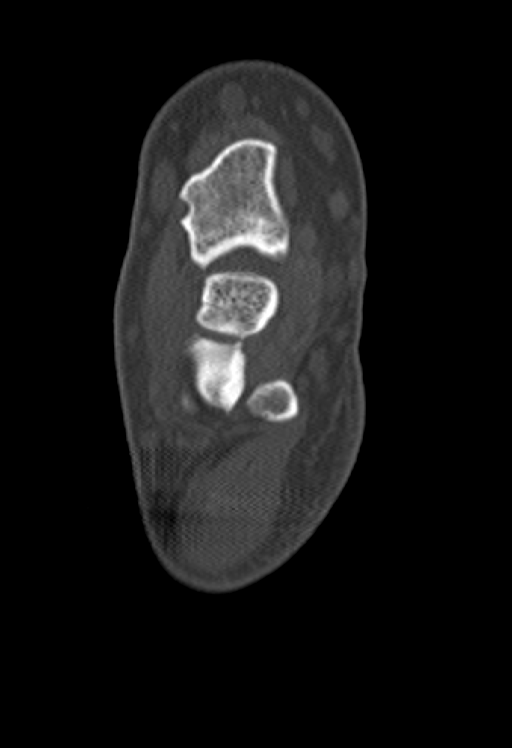
[im 99/198  soft-tissue]
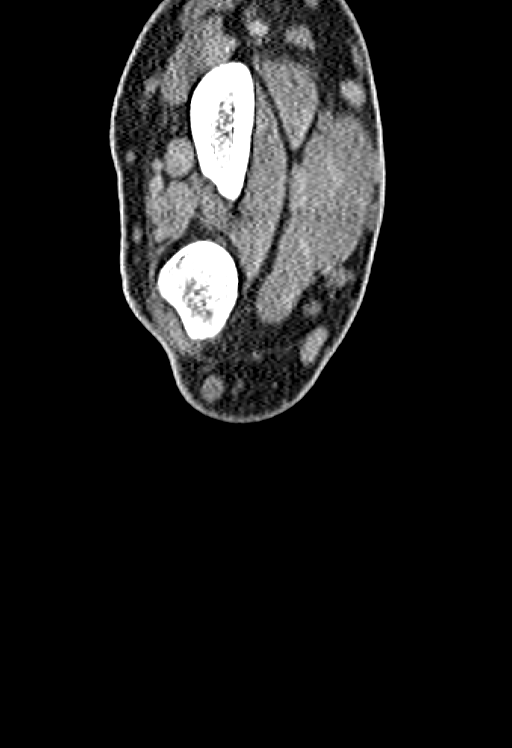
[im 99/198  bone]
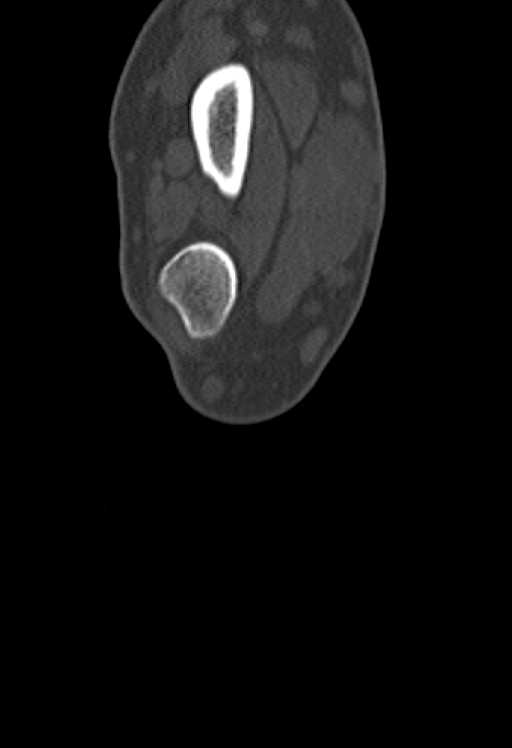
[im 115/198  bone]
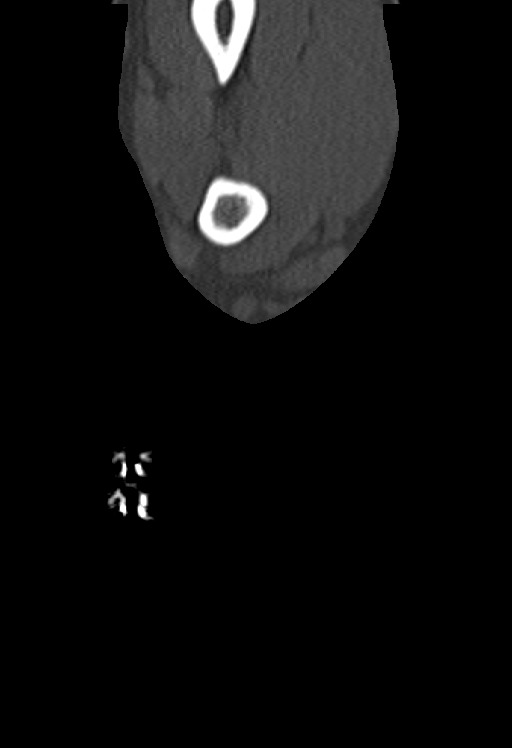
[im 132/198  bone]
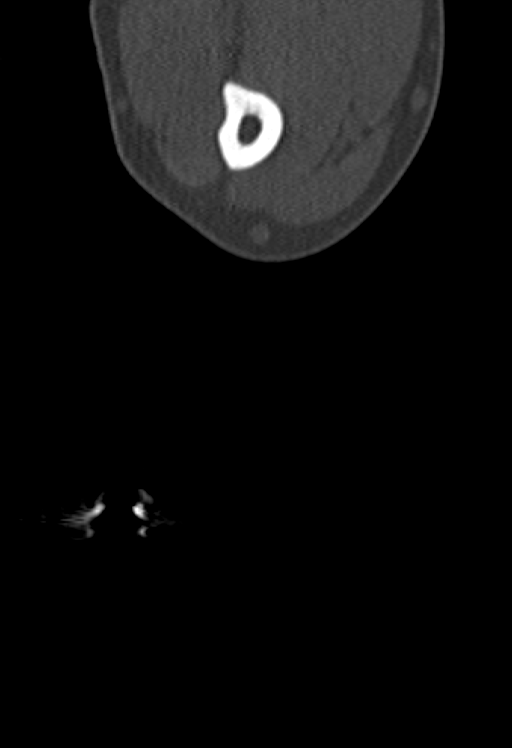

[Series 7: extremity 1.5 mpr cor · coronal · 0.40mm/px · 1 of 60 slices shown]
[im 30/60  bone]
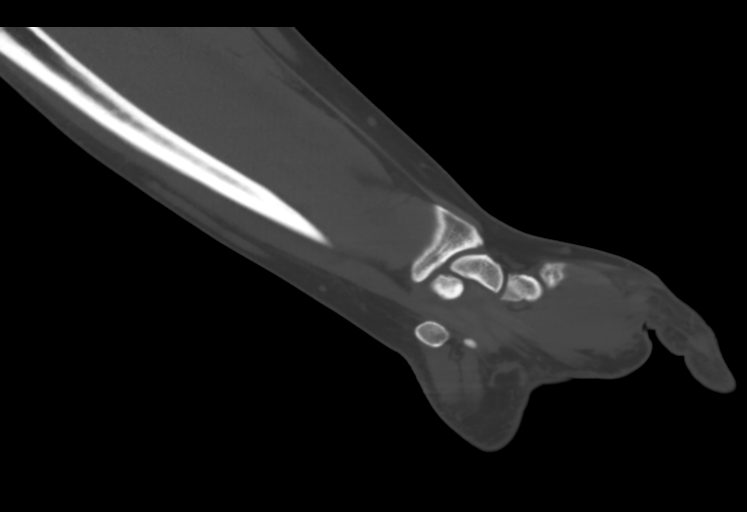

[7 of 33 positions shown; findings below may reference images not displayed]

FINDINGS: Bones/Joint/Cartilage

Minimally displaced fracture involving the dorsal cortex of the
proximal scaphoid (series 3, images 162-166). Additional minimally
displaced fracture involving the dorsal cortex of the distal
capitate with intra-articular involvement of the fourth CMC joint
(series 3, images 199-201; series 11, images 11-12). No additional
fractures. Carpal intraosseous spaces are maintained. No
malalignment. No significant degenerative findings.

Ligaments

Suboptimally assessed by CT.

Muscles and Tendons

No musculotendinous abnormality identified within the limitations of
this exam.

Soft tissues

Soft tissue swelling at the dorsal aspect of the carpus. No soft
tissue fluid collection or hematoma.
IMPRESSION: 1. Minimally displaced fracture involving the dorsal cortex of the
proximal scaphoid.
2. Additional minimally displaced fracture involving the dorsal
cortex of the distal capitate with intra-articular involvement of
the fourth CMC joint.

## 2021-05-29 ENCOUNTER — Telehealth: Payer: Self-pay | Admitting: Neurology

## 2021-05-29 NOTE — Telephone Encounter (Signed)
LVM for pt to call me back to schedule sleep study  

## 2021-06-27 ENCOUNTER — Telehealth: Payer: Self-pay | Admitting: Neurology

## 2021-06-27 NOTE — Telephone Encounter (Signed)
Pt canceled appt via automated system and lvm requesting a call back to r/s. I called pt back to r/s and lvm.

## 2021-07-13 ENCOUNTER — Other Ambulatory Visit: Payer: Self-pay

## 2021-07-13 ENCOUNTER — Ambulatory Visit (INDEPENDENT_AMBULATORY_CARE_PROVIDER_SITE_OTHER): Payer: No Typology Code available for payment source | Admitting: Neurology

## 2021-07-13 DIAGNOSIS — R634 Abnormal weight loss: Secondary | ICD-10-CM

## 2021-07-13 DIAGNOSIS — G4733 Obstructive sleep apnea (adult) (pediatric): Secondary | ICD-10-CM

## 2021-07-13 DIAGNOSIS — E669 Obesity, unspecified: Secondary | ICD-10-CM

## 2021-07-13 DIAGNOSIS — R0683 Snoring: Secondary | ICD-10-CM

## 2021-07-13 DIAGNOSIS — R351 Nocturia: Secondary | ICD-10-CM

## 2021-07-13 DIAGNOSIS — R519 Headache, unspecified: Secondary | ICD-10-CM

## 2021-07-13 DIAGNOSIS — G4719 Other hypersomnia: Secondary | ICD-10-CM

## 2021-07-13 DIAGNOSIS — R7989 Other specified abnormal findings of blood chemistry: Secondary | ICD-10-CM

## 2021-07-13 DIAGNOSIS — G2581 Restless legs syndrome: Secondary | ICD-10-CM

## 2021-07-13 DIAGNOSIS — G479 Sleep disorder, unspecified: Secondary | ICD-10-CM

## 2021-07-13 DIAGNOSIS — G4761 Periodic limb movement disorder: Secondary | ICD-10-CM

## 2021-07-25 NOTE — Procedures (Signed)
PATIENT'S NAME:  Paul Russo, Paul Russo DOB:      1985/07/24      MR#:    [id number]     DATE OF RECORDING: 07/13/2021 REFERRING M.D.:  Scarlette Calico, MD Study Performed:   Baseline Polysomnogram HISTORY: 36 year old man with a history of hypertension, hyperlipidemia, depression, renal cell cancer with status post partial left nephrectomy on 04/03/2021, low T, and obesity, who reports snoring and excessive daytime somnolence. The patient endorsed the Epworth Sleepiness Scale at 9 points. The patient's weight 227 pounds with a height of 70 (inches), resulting in a BMI of 32.5 kg/m2. The patient's neck circumference measured 17.2 inches.  CURRENT MEDICATIONS: Wellbutrin, Clomid, Colace, Lexapro, Lamictal, Keppra, Ultram   PROCEDURE:  This is a multichannel digital polysomnogram utilizing the Somnostar 11.2 system.  Electrodes and sensors were applied and monitored per AASM Specifications.   EEG, EOG, Chin and Limb EMG, were sampled at 200 Hz.  ECG, Snore and Nasal Pressure, Thermal Airflow, Respiratory Effort, CPAP Flow and Pressure, Oximetry was sampled at 50 Hz. Digital video and audio were recorded.      BASELINE STUDY  Lights Out was at 22:06 and Lights On at 05:02.  Total recording time (TRT) was 416.5 minutes, with a total sleep time (TST) of 340 minutes.   The patient's sleep latency was 62.5 minutes, which is delayed. REM latency was 70.5 minutes, which is normal. The sleep efficiency was 81.6 %.     SLEEP ARCHITECTURE: WASO (Wake after sleep onset) was 18 minutes.  There were 1.5 minutes in Stage N1, 221 minutes Stage N2, 34 minutes Stage N3 and 83.5 minutes in Stage REM.  The percentage of Stage N1 was .4%, Stage N2 was 65.%, which is increased, Stage N3 was 10.% and Stage R (REM sleep) was 24.6%, which is normal. The arousals were noted as: 22 were spontaneous, 0 were associated with PLMs, 79 were associated with respiratory events.  RESPIRATORY ANALYSIS:  There were a total of 100 respiratory events:   83 obstructive apneas, 13 central apneas and 1 mixed apneas with a total of 97 apneas and an apnea index (AI) of 17.1 /hour. There were 3 hypopneas with a hypopnea index of .5 /hour. The patient also had 0 respiratory event related arousals (RERAs).      The total APNEA/HYPOPNEA INDEX (AHI) was 17.6/hour and the total RESPIRATORY DISTURBANCE INDEX was  17.6 /hour.  23 events occurred in REM sleep and 19 events in NREM. The REM AHI was  16.5 /hour, versus a non-REM AHI of 18.. The patient spent 66 minutes of total sleep time in the supine position and 274 minutes in non-supine.. The supine AHI was 60.0 versus a non-supine AHI of 7.4.  OXYGEN SATURATION & C02:  The Wake baseline 02 saturation was 92%, with the lowest being 83%. Time spent below 89% saturation equaled 36 minutes.  PERIODIC LIMB MOVEMENTS: The patient had a total of 6 Periodic Limb Movements.  The Periodic Limb Movement (PLM) index was 1.1 and the PLM Arousal index was 0/hour.  Audio and video analysis did not show any abnormal or unusual movements, behaviors, phonations or vocalizations. The patient took no bathroom breaks. Mild to moderate snoring was noted. The EKG was in keeping with normal sinus rhythm (NSR).  Post-study, the patient indicated that sleep was worse than usual.   IMPRESSION:  Obstructive Sleep Apnea (OSA)  RECOMMENDATIONS:  This study demonstrates moderate to severe obstructive sleep apnea, with a total AHI of 17.6/hour, REM AHI of 16.5/hour,  supine AHI of 60/hour and O2 nadir of 83%. Treatment with positive airway pressure in the form of CPAP is recommended. This will require a full night titration study to determine proper treatment settings, optimize mask fit and monitor oxygen saturations. Other treatment options may include surgical options through ENT or dental treatment through dentistry.  Concomitant weight loss is recommended.  Please note that untreated obstructive sleep apnea may carry additional  perioperative morbidity. Patients with significant obstructive sleep apnea should receive perioperative PAP therapy and the surgeons and particularly the anesthesiologist should be informed of the diagnosis and the severity of the sleep disordered breathing. The patient should be cautioned not to drive, work at heights, or operate dangerous or heavy equipment when tired or sleepy. Review and reiteration of good sleep hygiene measures should be pursued with any patient. The patient will be advised of the results and encouraged to return for a formal titration study. The patient will then be seen in follow-up in the sleep clinic at [facility] for discussion of the test results, symptom and treatment compliance review, further management strategies, etc. The referring provider will be notified of the test results.   I certify that I have reviewed the entire raw data recording prior to the issuance of this report in accordance with the Standards of Accreditation of the American Academy of Sleep Medicine (AASM)    Star Age, MD, PhD Diplomat, American Board of Neurology and Sleep Medicine (Neurology and Sleep Medicine)

## 2021-07-25 NOTE — Addendum Note (Signed)
Addended by: Star Age on: 07/25/2021 06:27 PM   Modules accepted: Orders

## 2021-07-26 ENCOUNTER — Telehealth: Payer: Self-pay

## 2021-07-26 DIAGNOSIS — G4733 Obstructive sleep apnea (adult) (pediatric): Secondary | ICD-10-CM

## 2021-07-26 NOTE — Telephone Encounter (Signed)
Pt called and LVM for a nurse to call back to discuss his results and next steps after his sleep study. Please call pt at earliest convenience

## 2021-07-26 NOTE — Telephone Encounter (Signed)
I was able to talk to patient on his cell phone.  We talked about his sleep study results and also treatment option of AutoPap versus CPAP and the benefits and downsides of each.  We mutually agreed to go ahead with treating him for now with AutoPap therapy.  He is willing to establish with a local DME company.  I have placed an order for AutoPap therapy.  Please send to DME. I advised him that we may consider a formal titration study if the need arises in the future.  He is reminded to be consistent with his AutoPap to fulfill insurance compliance criteria and to reap full benefit from therapy, and also schedule a follow-up within 1 to 3 months after starting treatment.

## 2021-07-26 NOTE — Telephone Encounter (Signed)
I called Dr. Gloriann Loan.  I relayed the results and your recommendation.   He asked about autopap, I relayed that she recommended the CPAP (continuous pressure).  He asked if you could call him at his mobile to discuss.

## 2021-07-26 NOTE — Addendum Note (Signed)
Addended by: Star Age on: 07/26/2021 05:00 PM   Modules accepted: Orders

## 2021-07-26 NOTE — Telephone Encounter (Signed)
Result sent to PCP.   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 Ronnald Ramp, seen by me on 04/11/21, diagnostic PSG on 07/13/21.   Please call and notify the patient that the recent sleep study showed moderate to severe obstructive sleep apnea. I recommend treatment for this in the form of CPAP. This will require a repeat sleep study for proper titration and mask fitting and correct monitoring of the oxygen saturations. I have placed an order in the chart. Thanks.   Star Age, MD, PhD [facility])

## 2021-07-27 ENCOUNTER — Telehealth: Payer: Self-pay | Admitting: *Deleted

## 2021-07-27 ENCOUNTER — Encounter: Payer: Self-pay | Admitting: *Deleted

## 2021-07-27 NOTE — Telephone Encounter (Signed)
Prescription/Demog/ Insurance/last ofv note/ Sleep Study faxed to [place] for autopap.  Fax confirmation received [phone], ofv [id number].

## 2021-07-27 NOTE — Telephone Encounter (Addendum)
Faxed to [place] for autopap with confirmation received.  (Order, ofv note, sleep study, demog, insurance cards).  [phone] fax, [phone] ofv note.  Letter was sent.

## 2021-08-22 ENCOUNTER — Telehealth: Payer: Self-pay | Admitting: Neurology

## 2021-08-22 NOTE — Telephone Encounter (Signed)
noted 

## 2021-08-22 NOTE — Telephone Encounter (Signed)
Ok thank you 

## 2021-08-22 NOTE — Telephone Encounter (Signed)
Pt called stating that he can not take off of work for this appt and is wanting to speak to the RN to see what can be done for him to still be in compliance. Please advise.

## 2021-08-22 NOTE — Telephone Encounter (Signed)
..   Pt understands that although there may be some limitations with this type of visit, we will take all precautions to reduce any security or privacy concerns.  Pt understands that this will be treated like an in office visit and we will file with pt's insurance, and there may be a patient responsible charge related to this service. ? ?

## 2021-08-22 NOTE — Telephone Encounter (Signed)
Pt was called and he accepted 04-25, he is aware to log on at 11:00 for the 11:15 appointment

## 2021-08-29 ENCOUNTER — Telehealth: Payer: Self-pay

## 2021-08-29 NOTE — Telephone Encounter (Signed)
Called pt to schedule his cpap sleep study but pt does not with to have study at this time. I advised that his orders would be good until 01/24 ?

## 2021-09-24 ENCOUNTER — Other Ambulatory Visit: Payer: Self-pay | Admitting: Internal Medicine

## 2021-09-24 DIAGNOSIS — E559 Vitamin D deficiency, unspecified: Secondary | ICD-10-CM

## 2021-09-25 ENCOUNTER — Ambulatory Visit: Payer: No Typology Code available for payment source | Admitting: Neurology

## 2021-10-17 ENCOUNTER — Telehealth: Payer: Self-pay | Admitting: Adult Health

## 2021-10-17 ENCOUNTER — Telehealth: Payer: No Typology Code available for payment source | Admitting: Adult Health

## 2021-10-17 ENCOUNTER — Encounter: Payer: Self-pay | Admitting: Internal Medicine

## 2021-10-17 ENCOUNTER — Ambulatory Visit (INDEPENDENT_AMBULATORY_CARE_PROVIDER_SITE_OTHER): Payer: No Typology Code available for payment source | Admitting: Internal Medicine

## 2021-10-17 VITALS — BP 116/76 | HR 73 | Temp 98.4°F | Ht 70.0 in | Wt 240.0 lb

## 2021-10-17 DIAGNOSIS — Z0001 Encounter for general adult medical examination with abnormal findings: Secondary | ICD-10-CM

## 2021-10-17 DIAGNOSIS — E559 Vitamin D deficiency, unspecified: Secondary | ICD-10-CM

## 2021-10-17 DIAGNOSIS — R739 Hyperglycemia, unspecified: Secondary | ICD-10-CM | POA: Diagnosis not present

## 2021-10-17 DIAGNOSIS — E291 Testicular hypofunction: Secondary | ICD-10-CM

## 2021-10-17 DIAGNOSIS — Z6832 Body mass index (BMI) 32.0-32.9, adult: Secondary | ICD-10-CM

## 2021-10-17 DIAGNOSIS — G4733 Obstructive sleep apnea (adult) (pediatric): Secondary | ICD-10-CM

## 2021-10-17 DIAGNOSIS — E6609 Other obesity due to excess calories: Secondary | ICD-10-CM

## 2021-10-17 LAB — CBC WITH DIFFERENTIAL/PLATELET
Basophils Absolute: 0.1 10*3/uL (ref 0.0–0.1)
Basophils Relative: 1 % (ref 0.0–3.0)
Eosinophils Absolute: 0.2 10*3/uL (ref 0.0–0.7)
Eosinophils Relative: 3.1 % (ref 0.0–5.0)
HCT: 48.4 % (ref 39.0–52.0)
Hemoglobin: 16.4 g/dL (ref 13.0–17.0)
Lymphocytes Relative: 27.8 % (ref 12.0–46.0)
Lymphs Abs: 2.1 10*3/uL (ref 0.7–4.0)
MCHC: 34 g/dL (ref 30.0–36.0)
MCV: 86.4 fl (ref 78.0–100.0)
Monocytes Absolute: 0.8 10*3/uL (ref 0.1–1.0)
Monocytes Relative: 10.4 % (ref 3.0–12.0)
Neutro Abs: 4.3 10*3/uL (ref 1.4–7.7)
Neutrophils Relative %: 57.7 % (ref 43.0–77.0)
Platelets: 358 10*3/uL (ref 150.0–400.0)
RBC: 5.6 Mil/uL (ref 4.22–5.81)
RDW: 13.1 % (ref 11.5–15.5)
WBC: 7.5 10*3/uL (ref 4.0–10.5)

## 2021-10-17 LAB — HEMOGLOBIN A1C: Hgb A1c MFr Bld: 5.3 % (ref 4.6–6.5)

## 2021-10-17 LAB — BASIC METABOLIC PANEL
BUN: 12 mg/dL (ref 6–23)
CO2: 24 mEq/L (ref 19–32)
Calcium: 9.3 mg/dL (ref 8.4–10.5)
Chloride: 105 mEq/L (ref 96–112)
Creatinine, Ser: 1.2 mg/dL (ref 0.40–1.50)
GFR: 76.87 mL/min (ref >60.00)
Glucose, Bld: 101 mg/dL — ABNORMAL HIGH (ref 70–99)
Potassium: 4.2 mEq/L (ref 3.5–5.1)
Sodium: 137 mEq/L (ref 135–145)

## 2021-10-17 LAB — LIPID PANEL
Cholesterol: 186 mg/dL (ref 0–200)
HDL: 41.9 mg/dL (ref >39.00)
LDL Cholesterol: 128 mg/dL — ABNORMAL HIGH (ref 0–99)
NonHDL: 143.99
Total CHOL/HDL Ratio: 4
Triglycerides: 81 mg/dL (ref 0.0–149.0)
VLDL: 16.2 mg/dL (ref 0.0–40.0)

## 2021-10-17 LAB — TESTOSTERONE TOTAL,FREE,BIO, MALES
Albumin: 4.3 g/dL (ref 3.6–5.1)
Sex Hormone Binding: 24 nmol/L (ref 10–50)
Testosterone, Bioavailable: 396.7 ng/dL (ref 110.0–575.0)
Testosterone, Free: 201.4 pg/mL (ref 46.0–224.0)
Testosterone: 966 ng/dL — ABNORMAL HIGH (ref 250–827)

## 2021-10-17 LAB — VITAMIN D 25 HYDROXY (VIT D DEFICIENCY, FRACTURES): VITD: 34.4 ng/mL (ref 30.00–100.00)

## 2021-10-17 LAB — TSH: TSH: 0.93 u[IU]/mL (ref 0.35–5.50)

## 2021-10-17 MED ORDER — SEMAGLUTIDE-WEIGHT MANAGEMENT 0.25 MG/0.5ML ~~LOC~~ SOAJ: 0.2500 mg | SUBCUTANEOUS | 0 refills | Status: AC

## 2021-10-17 MED ORDER — SEMAGLUTIDE-WEIGHT MANAGEMENT 1 MG/0.5ML ~~LOC~~ SOAJ: 1.0000 mg | SUBCUTANEOUS | 0 refills | Status: AC

## 2021-10-17 MED ORDER — SEMAGLUTIDE-WEIGHT MANAGEMENT 2.4 MG/0.75ML ~~LOC~~ SOAJ: 2.4000 mg | SUBCUTANEOUS | 0 refills | Status: AC

## 2021-10-17 MED ORDER — SEMAGLUTIDE-WEIGHT MANAGEMENT 1.7 MG/0.75ML ~~LOC~~ SOAJ: 1.7000 mg | SUBCUTANEOUS | 0 refills | Status: AC

## 2021-10-17 MED ORDER — SEMAGLUTIDE-WEIGHT MANAGEMENT 0.5 MG/0.5ML ~~LOC~~ SOAJ: 0.5000 mg | SUBCUTANEOUS | 0 refills | Status: AC

## 2021-10-17 NOTE — Progress Notes (Deleted)
[facility] [address] [place]. [place] [address] [phone]       HOSPITAL FOLLOW UP NOTE  Mr. Bashir Marchetti Date of Birth:  [birth year] Medical Record Number:  [id number]   Reason for Referral:  hospital stroke follow up  Virtual Visit via Video Note  Virtual visit completed through [place], a video enabled telemedicine application. Due to national recommendations of social distancing due to COVID-19, a virtual visit is felt to be most appropriate for this patient at this time. Reviewed limitations, risks, security and privacy concerns of performing a virtual visit and the availability of in person appointments. I also reviewed that there may be a patient responsible charge related to this service. The patient agreed to proceed.    Patient location: home Provider location: in office, [facility] Persons participating in this virtual visit: Patient and provider     SUBJECTIVE:   CHIEF COMPLAINT:  No chief complaint on file.   HPI:   Update 10/17/2021 JM: Patient is being seen for initial CPAP compliance visit via MyChart video visit.  Completed nocturnal polysomnography 07/13/2021 which showed moderate to severe OSA with total AHI 17.6/h, REM AHI 16.5/h, supine AHI of 60/h and O2 nadir of 83%.  Recommend initiating AutoPap and possible need of titration study in the future if indicated.  CPAP initiated 08/01/2021.  Review of compliance report as below.          Consult visit 04/11/2021 Dr. 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  He was diagnosed with low testosterone and has been on clomiphene.  His wife has noticed that he is a restless sleeper  and has twitching in his legs while asleep.  She has noticed the twitching in his legs consistently for the past 2 years.  Of note, he is followed by psychiatry for his mood.  He is currently on Lamictal once daily in the morning and Keppra 125 mg in the morning and 250 mg at bedtime.  He has been on these medications for years.  He also takes Wellbutrin once daily.  Bedtime is generally around 10 and rise time currently around 7.  He is recuperating fairly well since his surgery about 8 days ago and is scheduled to go back to work in about a week.  He has 2 children, ages 39 and 39, 1 cat in the household, no TV in the bedroom.  I reviewed your office note from 09/19/2020.  His Epworth sleepiness score is 9 out of 24, fatigue severity score is 42 out of 63. He has woken up occasionally with a headache and sometimes takes ibuprofen for headaches.  He has nocturia about once per average night.  He has seen Dr. Nelva Bush for lumbar radiculopathy and has received an injection which helped.  He drinks caffeine in the form of soda, about 3-4 servings per day and 1 or 2 cups of coffee, typically no caffeine after 1 PM on average.  He does not drink alcohol, he quit smoking some 10 years ago.  He is not aware of any family history of sleep apnea.  In the recent past he has lost weight and per wife, his snoring has indeed improved.  He denies telltale symptoms of restless leg syndrome.  Of note, he is on low-dose Lexapro  5 mg once daily.         ROS:   14 system review of systems performed and negative with exception of ***  PMH:  Past Medical History:  Diagnosis Date   Depression    Hyperlipidemia    Hypertension     PSH:  Past Surgical History:  Procedure Laterality Date   KNEE SURGERY     ROBOTIC ASSITED PARTIAL NEPHRECTOMY Left 04/03/2021   Procedure: XI ROBOTIC ASSITED LAPAROSCOPIC PARTIAL NEPHRECTOMY;  Surgeon: Raynelle Bring, MD;  Location: [facility];  Service: Urology;  Laterality: Left;   WISDOM  TOOTH EXTRACTION      Social History:  Social History   Socioeconomic History   Marital status: Married    Spouse name: Not on file   Number of children: Not on file   Years of education: Not on file   Highest education level: Not on file  Occupational History   Not on file  Tobacco Use   Smoking status: Former    Types: Cigarettes    Quit date: 2016    Years since quitting: 7.3   Smokeless tobacco: Never  Vaping Use   Vaping Use: Never used  Substance and Sexual Activity   Alcohol use: Not Currently   Drug use: Never   Sexual activity: Yes    Partners: Female    Birth control/protection: None  Other Topics Concern   Not on file  Social History Narrative   Caffeine 3 cups daily.  Urologist,  Education: MD. Works for [employer].   Social Determinants of Health   Financial Resource Strain: Not on file  Food Insecurity: Not on file  Transportation Needs: Not on file  Physical Activity: Not on file  Stress: Not on file  Social Connections: Not on file  Intimate Partner Violence: Not on file    Family History: No family history on file.  Medications:   Current Outpatient Medications on File Prior to Visit  Medication Sig Dispense Refill   buPROPion (WELLBUTRIN XL) 150 MG 24 hr tablet Take 150 mg by mouth every morning.     clomiPHENE (CLOMID) 50 MG tablet Take 25 mg by mouth See admin instructions. Take Mon., wed.,  fri and Saturday Only     docusate sodium (COLACE) 100 MG capsule Take 1 capsule (100 mg total) by mouth 2 (two) times daily.     escitalopram (LEXAPRO) 5 MG tablet Take 5 mg by mouth every morning.     lamoTRIgine (LAMICTAL) 150 MG tablet Take 150 mg by mouth daily.     levETIRAcetam (KEPPRA) 250 MG tablet Take 125-250 mg by mouth See admin instructions. Take 125 mg in the morning and 250 mg at bedtime     traMADol (ULTRAM) 50 MG tablet Take 1-2 tablets (50-100 mg total) by mouth every 6 (six) hours as needed for moderate pain or severe pain. 20 tablet  0   No current facility-administered medications on file prior to visit.    Allergies:   Allergies  Allergen Reactions   Shellfish Allergy Other (See Comments)    Scratchy throat      OBJECTIVE: General: well developed, well nourished, pleasant middle-age Caucasian male, seated, in no evident distress Head: head normocephalic and atraumatic.   Mental Status: Awake and fully alert. Oriented to place and time. Recent and remote memory intact. Attention span, concentration and fund of knowledge appropriate. Mood and affect appropriate.         ASSESSMENT: Sid Greener is a 36 y.o. year  old male with new diagnosis of OSA per sleep study on 07/13/2021 and AutoPap initiated 08/01/2021.     PLAN:  OSA on CPAP:     Follow up in *** or call earlier if needed   CC:  PCP: Janith Lima, MD    I spent *** minutes of face-to-face and non-face-to-face time with patient.  This included previsit chart review, lab review, study review, order entry, electronic health record documentation, patient education and discussion regarding new diagnosis of sleep apnea, review and discussion of compliance report and answered all other questions to patient satisfaction   Frann Rider, [facility]  [facility] [address] [place], [place] [address]  Phone [phone] Fax [phone] Note: This document was prepared with digital dictation and possible smart phrase technology. Any transcriptional errors that result from this process are unintentional.

## 2021-10-17 NOTE — Patient Instructions (Signed)
Health Maintenance, Male Adopting a healthy lifestyle and getting preventive care are important in promoting health and wellness. Ask your health care provider about: The right schedule for you to have regular tests and exams. Things you can do on your own to prevent diseases and keep yourself healthy. What should I know about diet, weight, and exercise? Eat a healthy diet  Eat a diet that includes plenty of vegetables, fruits, low-fat dairy products, and lean protein. Do not eat a lot of foods that are high in solid fats, added sugars, or sodium. Maintain a healthy weight Body mass index (BMI) is a measurement that can be used to identify possible weight problems. It estimates body fat based on height and weight. Your health care provider can help determine your BMI and help you achieve or maintain a healthy weight. Get regular exercise Get regular exercise. This is one of the most important things you can do for your health. Most adults should: Exercise for at least 150 minutes each week. The exercise should increase your heart rate and make you sweat (moderate-intensity exercise). Do strengthening exercises at least twice a week. This is in addition to the moderate-intensity exercise. Spend less time sitting. Even light physical activity can be beneficial. Watch cholesterol and blood lipids Have your blood tested for lipids and cholesterol at 38 years of age, then have this test every 5 years. You may need to have your cholesterol levels checked more often if: Your lipid or cholesterol levels are high. You are older than 38 years of age. You are at high risk for heart disease. What should I know about cancer screening? Many types of cancers can be detected early and may often be prevented. Depending on your health history and family history, you may need to have cancer screening at various ages. This may include screening for: Colorectal cancer. Prostate cancer. Skin cancer. Lung  cancer. What should I know about heart disease, diabetes, and high blood pressure? Blood pressure and heart disease High blood pressure causes heart disease and increases the risk of stroke. This is more likely to develop in people who have high blood pressure readings or are overweight. Talk with your health care provider about your target blood pressure readings. Have your blood pressure checked: Every 3-5 years if you are 18-39 years of age. Every year if you are 40 years old or older. If you are between the ages of 65 and 75 and are a current or former smoker, ask your health care provider if you should have a one-time screening for abdominal aortic aneurysm (AAA). Diabetes Have regular diabetes screenings. This checks your fasting blood sugar level. Have the screening done: Once every three years after age 45 if you are at a normal weight and have a low risk for diabetes. More often and at a younger age if you are overweight or have a high risk for diabetes. What should I know about preventing infection? Hepatitis B If you have a higher risk for hepatitis B, you should be screened for this virus. Talk with your health care provider to find out if you are at risk for hepatitis B infection. Hepatitis C Blood testing is recommended for: Everyone born from 1945 through 1965. Anyone with known risk factors for hepatitis C. Sexually transmitted infections (STIs) You should be screened each year for STIs, including gonorrhea and chlamydia, if: You are sexually active and are younger than 38 years of age. You are older than 38 years of age and your   health care provider tells you that you are at risk for this type of infection. Your sexual activity has changed since you were last screened, and you are at increased risk for chlamydia or gonorrhea. Ask your health care provider if you are at risk. Ask your health care provider about whether you are at high risk for HIV. Your health care provider  may recommend a prescription medicine to help prevent HIV infection. If you choose to take medicine to prevent HIV, you should first get tested for HIV. You should then be tested every 3 months for as long as you are taking the medicine. Follow these instructions at home: Alcohol use Do not drink alcohol if your health care provider tells you not to drink. If you drink alcohol: Limit how much you have to 0-2 drinks a day. Know how much alcohol is in your drink. In the [place] one drink equals one 12 oz bottle of beer (355 mL), one 5 oz glass of wine (148 mL), or one 1 oz glass of hard liquor (44 mL). Lifestyle Do not use any products that contain nicotine or tobacco. These products include cigarettes, chewing tobacco, and vaping devices, such as e-cigarettes. If you need help quitting, ask your health care provider. Do not use street drugs. Do not share needles. Ask your health care provider for help if you need support or information about quitting drugs. General instructions Schedule regular health, dental, and eye exams. Stay current with your vaccines. Tell your health care provider if: You often feel depressed. You have ever been abused or do not feel safe at home. Summary Adopting a healthy lifestyle and getting preventive care are important in promoting health and wellness. Follow your health care provider's instructions about healthy diet, exercising, and getting tested or screened for diseases. Follow your health care provider's instructions on monitoring your cholesterol and blood pressure. This information is not intended to replace advice given to you by your health care provider. Make sure you discuss any questions you have with your health care provider. Document Revised: 10/31/2020 Document Reviewed: 10/31/2020 Elsevier Patient Education  2023 Elsevier Inc.  

## 2021-10-17 NOTE — Progress Notes (Signed)
? ?Subjective:  ?Patient ID: Paul Russo, male    DOB: 1985/06/30  Age: 36 y.o. MRN: [id number] ? ?CC: Annual Exam ? ? ?HPI ?Paul Russo presents for f/up and CPX. ? ?He is using a testosterone supplement.  His last dose was 2 days ago.  He is gaining weight and would like to start using a GLP-1 agonist to help ? ?Outpatient Medications Prior to Visit  ?Medication Sig Dispense Refill  ? buPROPion (WELLBUTRIN XL) 150 MG 24 hr tablet Take 150 mg by mouth every morning.    ? lamoTRIgine (LAMICTAL) 150 MG tablet Take 150 mg by mouth daily.    ? levETIRAcetam (KEPPRA) 250 MG tablet Take 125-250 mg by mouth See admin instructions. Take 125 mg in the morning and 250 mg at bedtime    ? clomiPHENE (CLOMID) 50 MG tablet Take 25 mg by mouth See admin instructions. Take Mon., wed.,  fri and Saturday Only    ? docusate sodium (COLACE) 100 MG capsule Take 1 capsule (100 mg total) by mouth 2 (two) times daily.    ? escitalopram (LEXAPRO) 5 MG tablet Take 5 mg by mouth every morning.    ? traMADol (ULTRAM) 50 MG tablet Take 1-2 tablets (50-100 mg total) by mouth every 6 (six) hours as needed for moderate pain or severe pain. 20 tablet 0  ? ?No facility-administered medications prior to visit.  ? ? ?ROS ?Review of Systems  ?Constitutional:  Positive for unexpected weight change (wt gain). Negative for chills, diaphoresis and fatigue.  ?HENT: Negative.    ?Eyes: Negative.   ?Respiratory:  Positive for apnea. Negative for cough, chest tightness, shortness of breath and wheezing.   ?Cardiovascular:  Negative for chest pain, palpitations and leg swelling.  ?Gastrointestinal:  Negative for abdominal pain, blood in stool, constipation, diarrhea, nausea and vomiting.  ?Endocrine: Negative.   ?Genitourinary: Negative.  Negative for difficulty urinating.  ?Musculoskeletal:  Positive for back pain.  ?Neurological: Negative.  Negative for dizziness, weakness, light-headedness and headaches.  ?Hematological:  Negative for adenopathy. Does not  bruise/bleed easily.  ?Psychiatric/Behavioral: Negative.    ? ?Objective:  ?BP 116/76 (BP Location: Right Arm, Patient Position: Sitting, Cuff Size: Large)   Pulse 73   Temp 98.4 ?F (36.9 ?C) (Oral)   Ht '5\' 10"'$  (1.778 m)   Wt 240 lb (108.9 kg)   SpO2 94%   BMI 34.44 kg/m?  ? ?BP Readings from Last 3 Encounters:  ?10/17/21 116/76  ?04/11/21 114/69  ?04/04/21 116/80  ? ? ?Wt Readings from Last 3 Encounters:  ?10/17/21 240 lb (108.9 kg)  ?04/11/21 227 lb 9.6 oz (103.2 kg)  ?04/04/21 239 lb 10.2 oz (108.7 kg)  ? ? ?Physical Exam ?Vitals reviewed.  ?Constitutional:   ?   Appearance: He is not ill-appearing.  ?HENT:  ?   Nose: Nose normal.  ?   Mouth/Throat:  ?   Mouth: Mucous membranes are moist.  ?Eyes:  ?   General: No scleral icterus. ?   Conjunctiva/sclera: Conjunctivae normal.  ?Cardiovascular:  ?   Rate and Rhythm: Normal rate and regular rhythm.  ?   Heart sounds: No murmur heard. ?Pulmonary:  ?   Effort: Pulmonary effort is normal.  ?   Breath sounds: No stridor. No wheezing, rhonchi or rales.  ?Abdominal:  ?   General: Abdomen is flat.  ?   Palpations: There is no mass.  ?   Tenderness: There is no abdominal tenderness. There is no guarding.  ?   Hernia:  No hernia is present.  ?Musculoskeletal:     ?   General: Normal range of motion.  ?   Cervical back: Neck supple.  ?   Right lower leg: No edema.  ?   Left lower leg: No edema.  ?Lymphadenopathy:  ?   Cervical: No cervical adenopathy.  ?Skin: ?   General: Skin is warm and dry.  ?Neurological:  ?   General: No focal deficit present.  ?   Mental Status: He is alert.  ?Psychiatric:     ?   Mood and Affect: Mood normal.     ?   Behavior: Behavior normal.  ? ? ?Lab Results  ?Component Value Date  ? WBC 7.5 10/17/2021  ? HGB 16.4 10/17/2021  ? HCT 48.4 10/17/2021  ? PLT 358.0 10/17/2021  ? GLUCOSE 101 (H) 10/17/2021  ? CHOL 186 10/17/2021  ? TRIG 81.0 10/17/2021  ? HDL 41.90 10/17/2021  ? LDLCALC 128 (H) 10/17/2021  ? ALT 41 09/19/2020  ? AST 17 09/19/2020  ?  NA 137 10/17/2021  ? K 4.2 10/17/2021  ? CL 105 10/17/2021  ? CREATININE 1.20 10/17/2021  ? BUN 12 10/17/2021  ? CO2 24 10/17/2021  ? TSH 0.93 10/17/2021  ? HGBA1C 5.3 10/17/2021  ? ? ?No results found. ? ?Assessment & Plan:  ? ?Paul Russo was seen today for annual exam. ? ?Diagnoses and all orders for this visit: ? ?Chronic hyperglycemia- His A1C is normal. ?-     Basic metabolic panel; Future ?-     Hemoglobin A1c; Future ?-     Hemoglobin A1c ?-     Basic metabolic panel ? ?Vitamin D deficiency- Vit D is normal now. ?-     VITAMIN D 25 Hydroxy (Vit-D Deficiency, Fractures); Future ?-     VITAMIN D 25 Hydroxy (Vit-D Deficiency, Fractures) ? ?Class 1 obesity due to excess calories without serious comorbidity with body mass index (BMI) of 32.0 to 32.9 in adult ?-     TSH; Future ?-     TSH ?-     Semaglutide-Weight Management 0.25 MG/0.5ML SOAJ; Inject 0.25 mg into the skin once a week for 28 days. ?-     Semaglutide-Weight Management 0.5 MG/0.5ML SOAJ; Inject 0.5 mg into the skin once a week for 28 days. ?-     Semaglutide-Weight Management 1 MG/0.5ML SOAJ; Inject 1 mg into the skin once a week for 28 days. ?-     Semaglutide-Weight Management 1.7 MG/0.75ML SOAJ; Inject 1.7 mg into the skin once a week for 28 days. ?-     Semaglutide-Weight Management 2.4 MG/0.75ML SOAJ; Inject 2.4 mg into the skin once a week for 28 days. ? ?Encounter for general adult medical examination with abnormal findings- Exam completed, labs reviewed, vaccines are UTD, no cancer screenings indicated, pt ed was given. ?-     Lipid panel; Future ?-     Lipid panel ? ?Hypogonadism in male- T level is in the acceptable range. ?-     CBC with Differential/Platelet; Future ?-     Testosterone Total,Free,Bio, Males; Future ?-     Testosterone Total,Free,Bio, Males ?-     CBC with Differential/Platelet ? ?OSA on CPAP ? ? ?I have discontinued Dr. Link Russo "Paul Russo"'s escitalopram, clomiPHENE, docusate sodium, and traMADol. I am also having him start on  Semaglutide-Weight Management, Semaglutide-Weight Management, Semaglutide-Weight Management, Semaglutide-Weight Management, and Semaglutide-Weight Management. Additionally, I am having him maintain his lamoTRIgine, buPROPion, and levETIRAcetam. ? ?Meds ordered this encounter  ?  Medications  ? Semaglutide-Weight Management 0.25 MG/0.5ML SOAJ  ?  Sig: Inject 0.25 mg into the skin once a week for 28 days.  ?  Dispense:  2 mL  ?  Refill:  0  ? Semaglutide-Weight Management 0.5 MG/0.5ML SOAJ  ?  Sig: Inject 0.5 mg into the skin once a week for 28 days.  ?  Dispense:  2 mL  ?  Refill:  0  ? Semaglutide-Weight Management 1 MG/0.5ML SOAJ  ?  Sig: Inject 1 mg into the skin once a week for 28 days.  ?  Dispense:  2 mL  ?  Refill:  0  ? Semaglutide-Weight Management 1.7 MG/0.75ML SOAJ  ?  Sig: Inject 1.7 mg into the skin once a week for 28 days.  ?  Dispense:  3 mL  ?  Refill:  0  ? Semaglutide-Weight Management 2.4 MG/0.75ML SOAJ  ?  Sig: Inject 2.4 mg into the skin once a week for 28 days.  ?  Dispense:  3 mL  ?  Refill:  0  ? ? ? ?Follow-up: Return in about 1 year (around 10/18/2022). ? ?Scarlette Calico, MD ?

## 2021-10-17 NOTE — Telephone Encounter (Signed)
Pt needs to be seen for CPAP before 10/29/2021 due to setup date.  ?Pt was no-show to his virtual appt 10/17/2021.  ?Pt states he thought he was getting a phone call and did not know how to work [employer].  ?Pt states he will be fine with next visit as mychart video now that he knows what to do.  ?Did not see any opening in schedule before 10/29/2021, Pt requesting to be fit in before then.  ?Would like a call back.  ?

## 2021-10-19 ENCOUNTER — Telehealth (INDEPENDENT_AMBULATORY_CARE_PROVIDER_SITE_OTHER): Payer: No Typology Code available for payment source | Admitting: Neurology

## 2021-10-19 ENCOUNTER — Encounter: Payer: Self-pay | Admitting: Neurology

## 2021-10-19 DIAGNOSIS — Z9989 Dependence on other enabling machines and devices: Secondary | ICD-10-CM | POA: Diagnosis not present

## 2021-10-19 DIAGNOSIS — G4733 Obstructive sleep apnea (adult) (pediatric): Secondary | ICD-10-CM

## 2021-10-19 NOTE — Progress Notes (Signed)
? ?Virtual Visit via Video Note ? ?I connected with Paul Russo on 10/19/21 at 12:45 PM EDT by a video enabled telemedicine application and verified that I am speaking with the correct person using two identifiers. ? ?Location: ?Patient: in the office at his work ?Provider: in the office  ?  ?I discussed the limitations of evaluation and management by telemedicine and the availability of in person appointments. The patient expressed understanding and agreed to proceed. ? ?History of Present Illness: ?10/19/2021 SS: Paul Russo here today for follow-up for initial CPAP visit.  Sent for sleep consult, after report of snoring and excessive daytime somnolence.  Had diagnostic PSG on 07/13/21 showed moderate to severe OSA.  Started AutoPap 08/01/21.  Review of compliance data for the last 30 days is listed below, in summary shows overall excellent compliance, AHI 0.8, leak is 9.5.  Has tried several different masks, semi full face didn't work well.  Is currently using nasal pillow mask.  He definitely feels better with CPAP, feels more rested.  He is happy with it. ? ? ? ? ?Copied Dr. Rexene Alberts Note 04/11/2021: I saw your patient, Paul Russo, upon your kind request, in my sleep clinic today for initial consultation of his sleep disorder, in particular, concern for underlying obstructive sleep apnea.  The patient is accompanied by his wife today.  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  He was diagnosed with low testosterone and has been on clomiphene.  His wife has noticed that he is a restless sleeper and has twitching in his legs while asleep.  She has noticed the twitching in his legs consistently for the past 2 years.  Of note, he is followed by psychiatry for  his mood.  He is currently on Lamictal once daily in the morning and Keppra 125 mg in the morning and 250 mg at bedtime.  He has been on these medications for years.  He also takes Wellbutrin once daily.  Bedtime is generally around 10 and rise time currently around 7.  He is recuperating fairly well since his surgery about 8 days ago and is scheduled to go back to work in about a week.  He has 2 children, ages 68 and 81, 1 cat in the household, no TV in the bedroom.  I reviewed your office note from 09/19/2020.  His Epworth sleepiness score is 9 out of 24, fatigue severity score is 42 out of 63. ?He has woken up occasionally with a headache and sometimes takes ibuprofen for headaches.  He has nocturia about once per average night.  He has seen Dr. Nelva Bush for lumbar radiculopathy and has received an injection which helped.  He drinks caffeine in the form of soda, about 3-4 servings per day and 1 or 2 cups of coffee, typically no caffeine after 1 PM on average.  He does not drink alcohol, he quit smoking some 10 years ago.  He is not aware of any family history of sleep apnea.  In the recent past he has lost weight and per wife, his snoring has indeed improved.  He denies telltale symptoms of restless leg syndrome.  Of note, he is on low-dose Lexapro 5 mg once daily. ?  ?Observations/Objective: ?Via virtual visit is alert and oriented, speech is clear and concise, facial symmetry noted,  excellent historian, moves about freely ? ?Assessment and Plan: ?1.  OSA on CPAP ?-CPAP download indicates overall excellent compliance, commended on this, encouraged to continue nightly use greater than 4 hours ?-I will send an order to his DME for continued supplies, with continued settings, he will reach out for any problems or concerns ? ?Follow Up Instructions: ?1 year for CPAP revisit, October 11, 2022 at 1245 VV ?  ?I discussed the assessment and treatment plan with the patient. The patient was provided an opportunity to ask  questions and all were answered. The patient agreed with the plan and demonstrated an understanding of the instructions. ?  ?The patient was advised to call back or seek an in-person evaluation if the symptoms worsen or if the condition fails to improve as anticipated. ? ?Butler Denmark, AGNP-C, DNP  ?[facility] ?[address], [address] ?[place], [place] [address] ?[phone] ? ?

## 2021-11-01 ENCOUNTER — Encounter: Payer: Self-pay | Admitting: Internal Medicine

## 2022-10-11 ENCOUNTER — Telehealth: Payer: No Typology Code available for payment source | Admitting: Neurology

## 2022-10-11 ENCOUNTER — Telehealth: Payer: Self-pay

## 2022-10-11 DIAGNOSIS — G4733 Obstructive sleep apnea (adult) (pediatric): Secondary | ICD-10-CM

## 2022-10-11 NOTE — Telephone Encounter (Signed)
Faxed CPAP orders to [facility]

## 2022-10-11 NOTE — Patient Instructions (Signed)
Keep up the great work on CPAP! Continue to use nightly for greater than 4 hours I will send an order to your DME for continued supplies Let me know if you need anything See you in 1 year! Thanks!!

## 2022-10-11 NOTE — Progress Notes (Signed)
Virtual Visit via Video Note  I connected with Paul Russo on 10/11/22 at 12:45 PM EDT by a video enabled telemedicine application and verified that I am speaking with the correct person using two identifiers.  Location: Patient: in the office at his work Provider: in the office    I discussed the limitations of evaluation and management by telemedicine and the availability of in person appointments. The patient expressed understanding and agreed to proceed.  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  Download below shows overall very good compliance with an AHI of 1, 0 mask leak.  He is using a fullface mask.  Tolerates CPAP well.  Continues to see good benefit.  He did increase his pressure 12-17, felt he was snoring more.  Pressure changes are better.  No new issues or concerns.     10/19/2021 SS: Paul Russo here today for follow-up for initial CPAP visit.  Sent for sleep consult, after report of snoring and excessive daytime somnolence.  Had diagnostic PSG on 07/13/21 showed moderate to severe OSA.  Started AutoPap 08/01/21.  Review of compliance data for the last 30 days is listed below, in summary shows overall excellent compliance, AHI 0.8, leak is 9.5.  Has tried several different masks, semi full face didn't work well.  Is currently using nasal pillow mask.  He definitely feels better with CPAP, feels more rested.  He is happy with it.     Copied Dr. Frances Furbish Note 04/11/2021: I saw your patient, Paul Russo, upon your kind request, in my sleep clinic today for initial consultation of his sleep disorder, in particular, concern for underlying obstructive sleep apnea.  The patient is accompanied by his wife today.  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  He was diagnosed with low testosterone and has been on clomiphene.  His wife has noticed that he is a restless sleeper and has twitching in his legs while asleep.  She has noticed the twitching in his legs consistently for the past 2 years.  Of note, he is followed by psychiatry for his mood.  He is currently on Lamictal once daily in the morning and Keppra 125 mg in the morning and 250 mg at bedtime.  He has been on these medications for years.  He also takes Wellbutrin once daily.  Bedtime is generally around 10 and rise time currently around 7.  He is recuperating fairly well since his surgery about 8 days ago and is scheduled to go back to work in about a week.  He has 2 children, ages 73 and 67, 1 cat in the household, no TV in the bedroom.  I reviewed your office note from 09/19/2020.  His Epworth sleepiness score is 9 out of 24, fatigue severity score is 42 out of 63. He has woken up occasionally with a headache and sometimes takes ibuprofen for headaches.  He has nocturia about once per average night.  He has seen Dr. Ethelene Hal for lumbar radiculopathy and has received an injection which helped.  He drinks caffeine in the form of soda, about 3-4 servings per day and 1 or 2 cups of coffee, typically no caffeine after 1 PM on average.  He does not drink alcohol, he quit smoking some 10 years  ago.  He is not aware of any family history of sleep apnea.  In the recent past he has lost weight and per wife, his snoring has indeed improved.  He denies telltale symptoms of restless leg syndrome.  Of note, he is on low-dose Lexapro 5 mg once daily.   Observations/Objective: Via virtual visit is alert and oriented, speech is clear and concise, facial symmetry noted, excellent historian, moves about freely  Assessment and Plan: 1.  OSA on CPAP -CPAP download indicates overall excellent compliance, commended on this, encouraged to continue nightly use  greater than 4 hours -I will send an order to his DME for continued supplies, with continued settings, he will reach out for any problems or concerns  Follow Up Instructions: 1 year for CPAP , 1 year VV   I discussed the assessment and treatment plan with the patient. The patient was provided an opportunity to ask questions and all were answered. The patient agreed with the plan and demonstrated an understanding of the instructions.   The patient was advised to call back or seek an in-person evaluation if the symptoms worsen or if the condition fails to improve as anticipated.  Otila Kluver, DNP  [facility] [address] [place], [place] [address] [phone]

## 2022-10-11 NOTE — Progress Notes (Deleted)
Faxed CPAP orders [facility]

## 2023-10-14 ENCOUNTER — Encounter: Payer: Self-pay | Admitting: Anesthesiology

## 2023-10-17 ENCOUNTER — Telehealth (INDEPENDENT_AMBULATORY_CARE_PROVIDER_SITE_OTHER): Payer: No Typology Code available for payment source | Admitting: Neurology

## 2023-10-17 DIAGNOSIS — G4733 Obstructive sleep apnea (adult) (pediatric): Secondary | ICD-10-CM

## 2023-10-17 NOTE — Patient Instructions (Signed)
 Great to see you today!! Please continue nightly CPAP use minimum 4 hours. Please continue to replace supplies routinely through DME. Follow-up in 1 year.  Thanks!!

## 2023-10-17 NOTE — Progress Notes (Signed)
 Virtual Visit via Video Note  I connected with Paul Russo on 10/17/23 at 12:45 PM EDT by a video enabled telemedicine application and verified that I am speaking with the correct person using two identifiers.  Location: Patient: in the office at his work Provider: in the office    I discussed the limitations of evaluation and management by telemedicine and the availability of in person appointments. The patient expressed understanding and agreed to proceed.  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 Setup date Feb 2023. Recent usage is a little less than baseline. 12-17 cm water . 90 day compliance data is below, greater than 4-hour usage 42%, AHI 0.6, leak 0.4.  Over the last few months he has had recurrent viral illness limiting usage.  He is motivated to improve use.  He does report that his wife notices snoring without the machine.  He uses a fullface mask.  He has no issues with supplies or equipment.     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  Download below shows overall very good compliance with an AHI of 1, 0 mask leak.  He is using a fullface mask.  Tolerates CPAP well.  Continues to see good benefit.  He did increase his pressure 12-17, felt he was snoring more.  Pressure changes are better.  No new issues or concerns.     10/19/2021 SS: Paul Russo here today for follow-up for initial CPAP visit.  Sent for sleep consult, after report of snoring and excessive daytime somnolence.  Had diagnostic PSG on 07/13/21 showed moderate to severe OSA.  Started AutoPap 08/01/21.  Review of compliance data for the last 30 days is listed below, in summary shows overall excellent compliance, AHI 0.8, leak is 9.5.  Has tried several different masks, semi full face didn't work well.  Is currently using nasal pillow mask.  He definitely feels better with CPAP, feels more rested.  He is happy with it.     Copied Dr. Omar Bibber Note 04/11/2021: I saw your  patient, Paul Russo, upon your kind request, in my sleep clinic today for initial consultation of his sleep disorder, in particular, concern for underlying obstructive sleep apnea.  The patient is accompanied by his wife today.  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  He was diagnosed with low testosterone  and has been on clomiphene.  His wife has noticed that he is a restless sleeper and has twitching in his legs while asleep.  She has noticed the twitching in his legs consistently for the past 2 years.  Of note, he is followed by psychiatry for his mood.  He is currently on Lamictal  once daily in the morning and Keppra  125 mg in the morning and 250 mg at bedtime.  He has been on these medications for years.  He also takes Wellbutrin  once daily.  Bedtime is generally around 10 and rise time currently around 7.  He is recuperating fairly well since his surgery about 8 days ago and is scheduled to go back to work in about a week.  He has 2 children, ages 67 and 31, 1 cat in the household, no TV in the bedroom.  I reviewed your office note from 09/19/2020.  His Epworth sleepiness score is  9 out of 24, fatigue severity score is 42 out of 63. He has woken up occasionally with a headache and sometimes takes ibuprofen for headaches.  He has nocturia about once per average night.  He has seen Dr. Rexanne Catalina for lumbar radiculopathy and has received an injection which helped.  He drinks caffeine in the form of soda, about 3-4 servings per day and 1 or 2 cups of coffee, typically no caffeine after 1 PM on average.  He does not drink alcohol, he quit smoking some 10 years ago.  He is not aware of any family history of sleep apnea.  In the recent past he has lost weight  and per wife, his snoring has indeed improved.  He denies telltale symptoms of restless leg syndrome.  Of note, he is on low-dose Lexapro  5 mg once daily.   Observations/Objective: Via virtual visit is alert and oriented, speech is clear and concise, facial symmetry noted, excellent historian, moves about freely  Assessment and Plan: 1.  OSA on CPAP -We will continue current settings.  We discussed the importance of nightly usage minimum 4 hours.  He continues to benefit from CPAP.  He can continue to receive supplies through his DME.  We will plan to follow-up in 1 year virtually.  Follow Up Instructions: 1 year for CPAP , 1 year VV   I discussed the assessment and treatment plan with the patient. The patient was provided an opportunity to ask questions and all were answered. The patient agreed with the plan and demonstrated an understanding of the instructions.   The patient was advised to call back or seek an in-person evaluation if the symptoms worsen or if the condition fails to improve as anticipated.  Cortland Ding, DNP  [facility] [address], [address] [place], [place] [address] [phone]

## 2024-06-13 ENCOUNTER — Other Ambulatory Visit: Payer: Self-pay | Admitting: Physician Assistant

## 2024-06-13 MED ORDER — XOFLUZA (80 MG DOSE) 1 X 80 MG PO TBPK: 80.0000 mg | ORAL_TABLET | Freq: Once | ORAL | 1 refills | Status: AC

## 2024-06-13 NOTE — Progress Notes (Signed)
 Pt called in with sudden onset of fever and chills with exposure to flu from daughter . Xofluza  called in

## 2024-10-22 ENCOUNTER — Telehealth: Admitting: Neurology
